# Patient Record
Sex: Female | Born: 1940
Health system: Southern US, Community
[De-identification: ages and names within clinical notes are randomized; demographics above are authoritative.]

## PROBLEM LIST (undated history)

## (undated) DIAGNOSIS — E785 Hyperlipidemia, unspecified: Secondary | ICD-10-CM

## (undated) DIAGNOSIS — M159 Polyosteoarthritis, unspecified: Secondary | ICD-10-CM

## (undated) DIAGNOSIS — I10 Essential (primary) hypertension: Secondary | ICD-10-CM

## (undated) HISTORY — DX: Essential (primary) hypertension: I10

## (undated) HISTORY — PX: APPENDECTOMY: SHX54

## (undated) HISTORY — PX: TONSILLECTOMY: SUR1361

## (undated) HISTORY — DX: Polyosteoarthritis, unspecified: M15.9

## (undated) HISTORY — DX: Hyperlipidemia, unspecified: E78.5

---

## 1998-01-06 LAB — HM DEXA SCAN: HM Dexa Scan: NORMAL

## 2005-12-22 ENCOUNTER — Emergency Department (HOSPITAL_COMMUNITY): Admission: EM | Admit: 2005-12-22 | Discharge: 2005-12-22 | Payer: Self-pay | Admitting: Emergency Medicine

## 2005-12-24 ENCOUNTER — Emergency Department (HOSPITAL_COMMUNITY): Admission: EM | Admit: 2005-12-24 | Discharge: 2005-12-24 | Payer: Self-pay | Admitting: Family Medicine

## 2007-02-19 ENCOUNTER — Emergency Department (HOSPITAL_COMMUNITY): Admission: EM | Admit: 2007-02-19 | Discharge: 2007-02-19 | Payer: Self-pay | Admitting: Emergency Medicine

## 2009-01-06 LAB — HM COLONOSCOPY

## 2009-01-29 IMAGING — CT CT CERVICAL SPINE W/O CM
4 of 5 series · 16 of 33 positions shown, 19 images · IV contrast (agent unspecified)
Comparison: None

CLINICAL DATA: Headache and neck pain with bilateral arm numbness status post fall.
 HEAD CT WITHOUT CONTRAST:
TECHNIQUE: Contiguous axial images were obtained from the base of the skull through the vertex according to standard protocol without contrast.
TECHNIQUE: Multidetector CT imaging of the cervical spine was performed.  Multiplanar CT  image reconstructions were also generated.

[Series 6: c_spine 2.0 b31s · axial · 0.25mm/px · z∈[-440,-356]mm · 3 of 85 slices shown]
[im 22/85  bone]
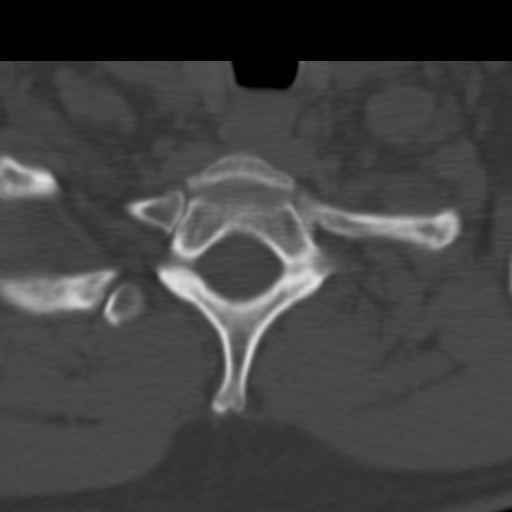
[im 43/85  bone]
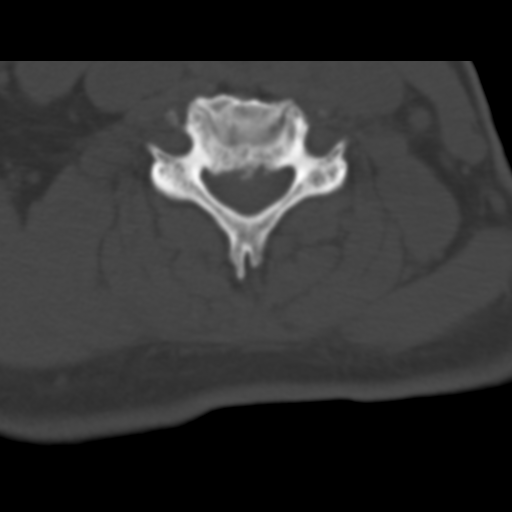
[im 64/85  bone]
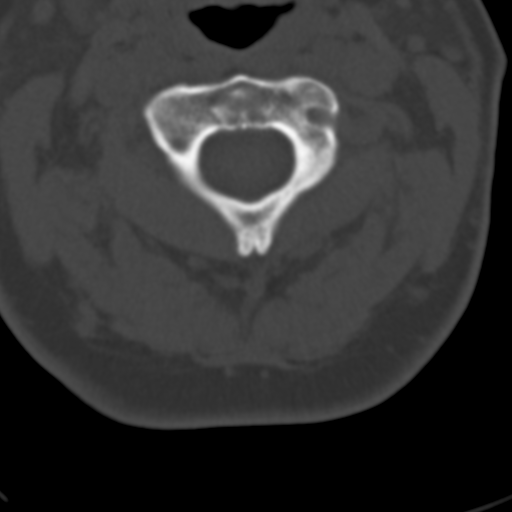

[Series 602: <mpr thick range> · coronal · 0.33mm/px · 3 of 74 slices shown]
[im 15/74  bone]
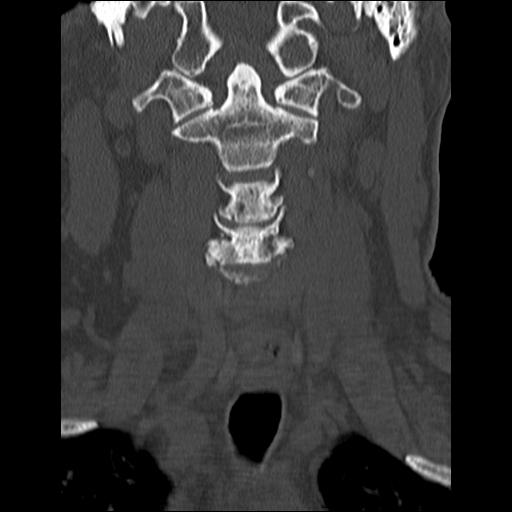
[im 30/74  bone]
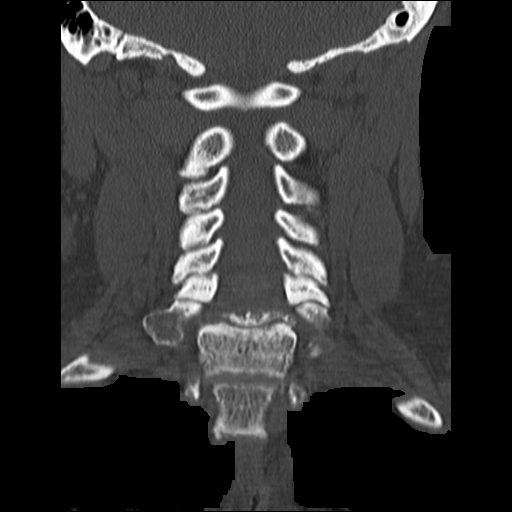
[im 44/74  bone]
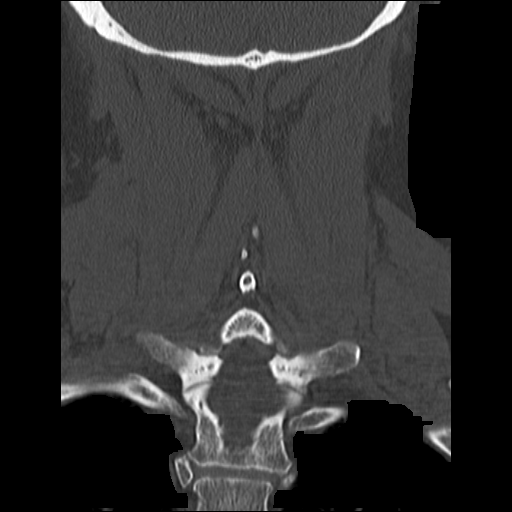

[Series 603: <mpr thick range(1)> · sagittal · 0.33mm/px · 5 of 61 slices shown, 6 images]
[im 21/61  bone]
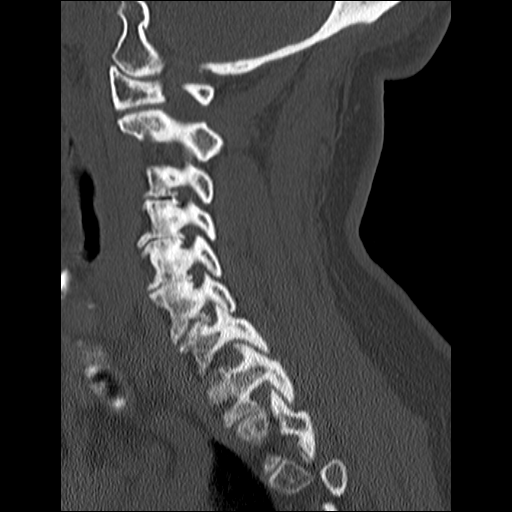
[im 26/61  bone]
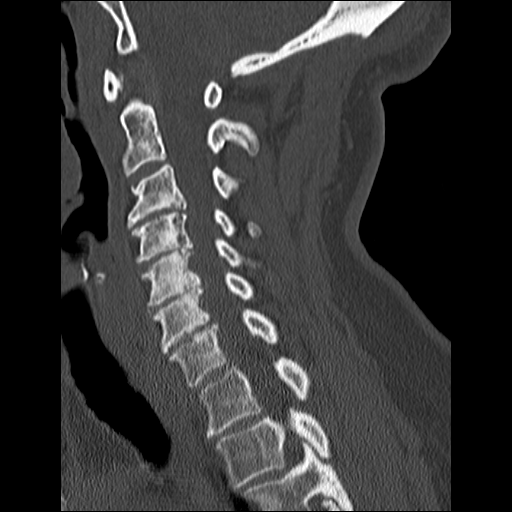
[im 31/61  soft-tissue]
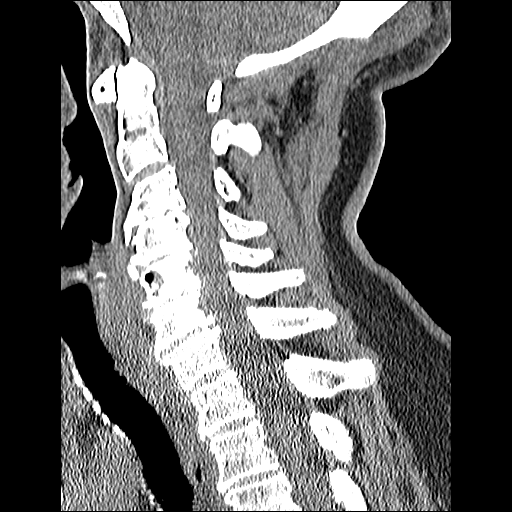
[im 31/61  bone]
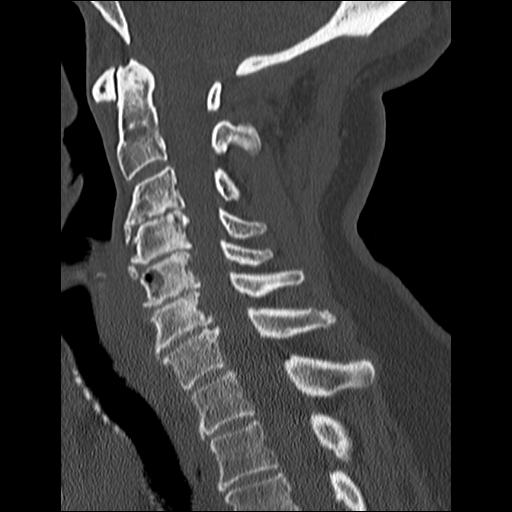
[im 36/61  bone]
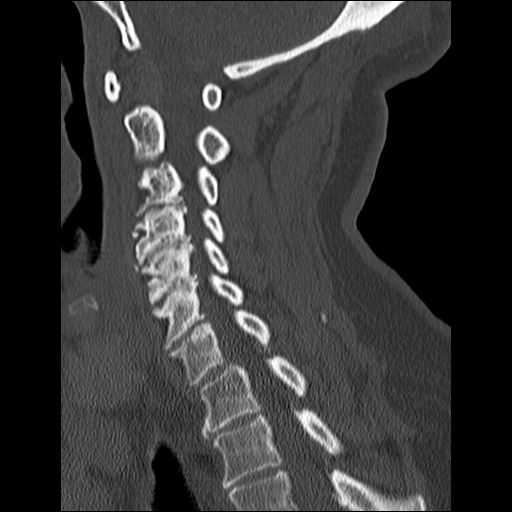
[im 41/61  bone]
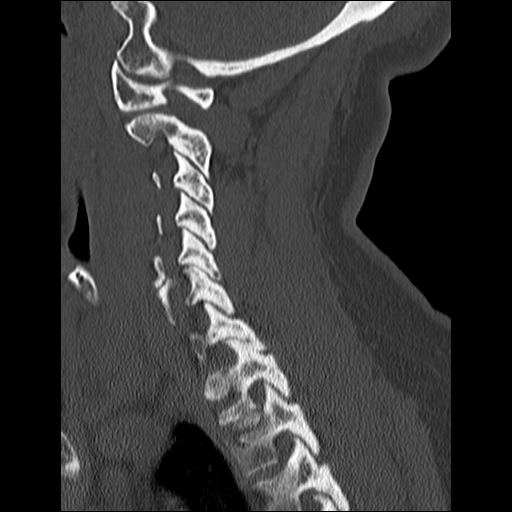

[Series 604: <mpr thick range(2)> · axial · 0.33mm/px · z∈[-484,-366]mm · 5 of 123 slices shown, 7 images]
[im 21/123  soft-tissue]
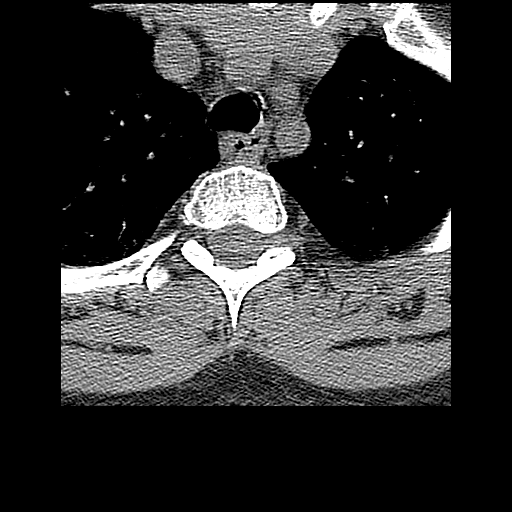
[im 21/123  bone]
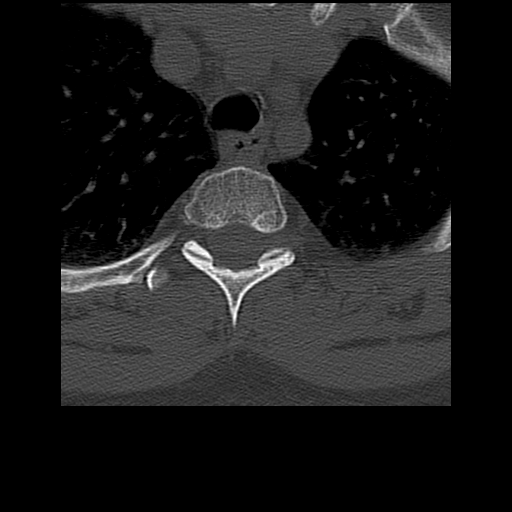
[im 41/123  bone]
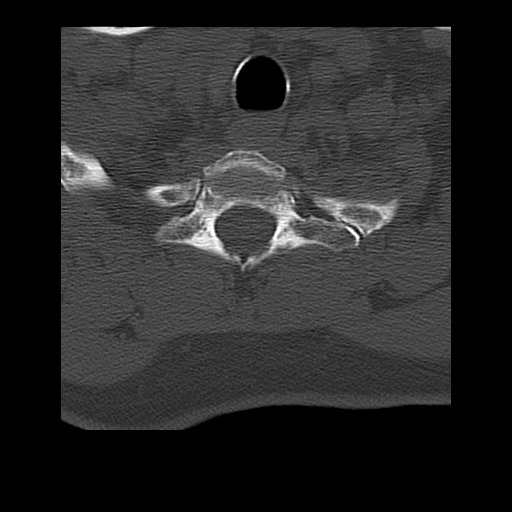
[im 62/123  bone]
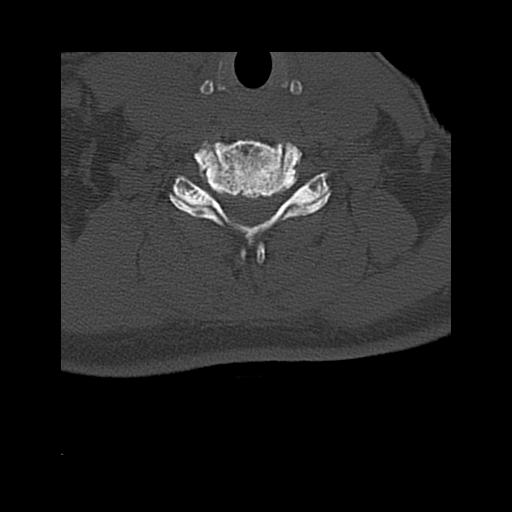
[im 82/123  bone]
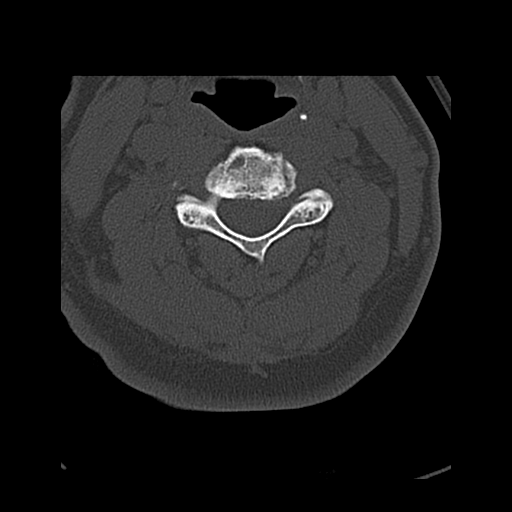
[im 102/123  soft-tissue]
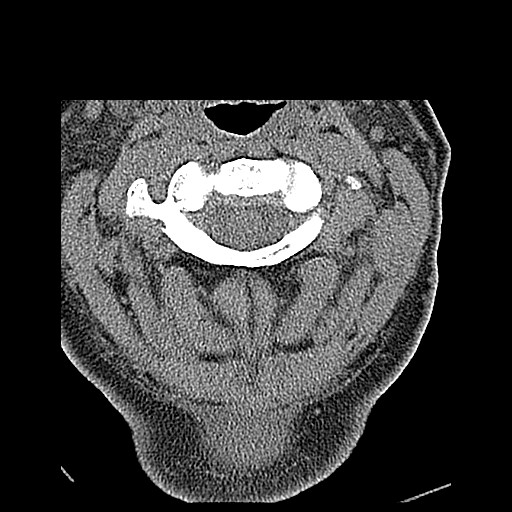
[im 102/123  bone]
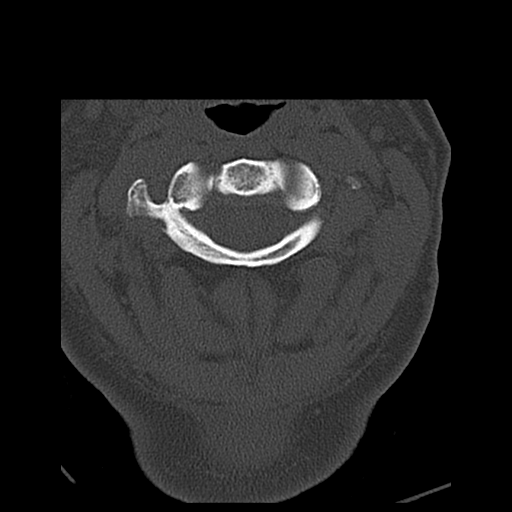

[16 of 33 positions shown; findings below may reference images not displayed]

FINDINGS: There is no evidence of acute intracranial hemorrhage, mass effect, brain edema, or extra-axial fluid collection.  Low density in the right lentiform nucleus may reflect a prominent perivascular space or remote lacunar infarct.  There is no evidence of cortical stroke.  The visualized paranasal sinuses are clear.  The calvarium is intact.
IMPRESSION: No acute intracranial or calvarial findings.  Old lacunar stroke in the right lentiform nucleus versus prominent perivascular space.
 CERVICAL SPINE CT WITHOUT CONTRAST:
FINDINGS: The alignment is normal.  There is no evidence of acute fracture or subluxation.  No acute soft tissue abnormalities are demonstrated.
 The patient has extensive cervical spondylosis with disk space loss, end plate cystic change and posterior osteophytes.  These contribute to mild spinal stenosis at several levels.  Notably the AP diameter of the canal is narrowed to approximately 9 mm at C4-5 and C5-6.  The foramina are narrowed at multiple levels as well.
IMPRESSION: Negative for acute cervical spine fracture, subluxation, or static signs of instability.  Diffuse cervical spondylosis as described.

## 2010-01-06 HISTORY — PX: CATARACT EXTRACTION W/ INTRAOCULAR LENS  IMPLANT, BILATERAL: SHX1307

## 2012-08-25 ENCOUNTER — Ambulatory Visit: Payer: Self-pay | Admitting: Internal Medicine

## 2012-11-09 ENCOUNTER — Encounter: Payer: Self-pay | Admitting: Internal Medicine

## 2012-11-09 ENCOUNTER — Ambulatory Visit (INDEPENDENT_AMBULATORY_CARE_PROVIDER_SITE_OTHER): Payer: Medicare Other | Admitting: Internal Medicine

## 2012-11-09 VITALS — BP 140/80 | HR 52 | Temp 98.6°F | Ht 65.0 in | Wt 208.0 lb

## 2012-11-09 DIAGNOSIS — E785 Hyperlipidemia, unspecified: Secondary | ICD-10-CM

## 2012-11-09 DIAGNOSIS — I10 Essential (primary) hypertension: Secondary | ICD-10-CM | POA: Insufficient documentation

## 2012-11-09 DIAGNOSIS — M159 Polyosteoarthritis, unspecified: Secondary | ICD-10-CM | POA: Insufficient documentation

## 2012-11-09 DIAGNOSIS — Z23 Encounter for immunization: Secondary | ICD-10-CM

## 2012-11-09 MED ORDER — METOPROLOL TARTRATE 100 MG PO TABS: 100.0000 mg | ORAL_TABLET | Freq: Every day | ORAL | Status: DC

## 2012-11-09 MED ORDER — SIMVASTATIN 20 MG PO TABS: 20.0000 mg | ORAL_TABLET | Freq: Every day | ORAL | Status: DC

## 2012-11-09 NOTE — Addendum Note (Signed)
Addended by: Sueanne Margarita on: 11/09/2012 12:48 PM   Modules accepted: Orders

## 2012-11-09 NOTE — Patient Instructions (Signed)
DASH Diet  The DASH diet stands for "Dietary Approaches to Stop Hypertension." It is a healthy eating plan that has been shown to reduce high blood pressure (hypertension) in as little as 14 days, while also possibly providing other significant health benefits. These other health benefits include reducing the risk of breast cancer after menopause and reducing the risk of type 2 diabetes, heart disease, colon cancer, and stroke. Health benefits also include weight loss and slowing kidney failure in patients with chronic kidney disease.   DIET GUIDELINES  · Limit salt (sodium). Your diet should contain less than 1500 mg of sodium daily.  · Limit refined or processed carbohydrates. Your diet should include mostly whole grains. Desserts and added sugars should be used sparingly.  · Include small amounts of heart-healthy fats. These types of fats include nuts, oils, and tub margarine. Limit saturated and trans fats. These fats have been shown to be harmful in the body.  CHOOSING FOODS   The following food groups are based on a 2000 calorie diet. See your Registered Dietitian for individual calorie needs.  Grains and Grain Products (6 to 8 servings daily)  · Eat More Often: Whole-wheat bread, brown rice, whole-grain or wheat pasta, quinoa, popcorn without added fat or salt (air popped).  · Eat Less Often: White bread, white pasta, white rice, cornbread.  Vegetables (4 to 5 servings daily)  · Eat More Often: Fresh, frozen, and canned vegetables. Vegetables may be raw, steamed, roasted, or grilled with a minimal amount of fat.  · Eat Less Often/Avoid: Creamed or fried vegetables. Vegetables in a cheese sauce.  Fruit (4 to 5 servings daily)  · Eat More Often: All fresh, canned (in natural juice), or frozen fruits. Dried fruits without added sugar. One hundred percent fruit juice (½ cup [237 mL] daily).  · Eat Less Often: Dried fruits with added sugar. Canned fruit in light or heavy syrup.  Lean Meats, Fish, and Poultry (2  servings or less daily. One serving is 3 to 4 oz [85-114 g]).  · Eat More Often: Ninety percent or leaner ground beef, tenderloin, sirloin. Round cuts of beef, chicken breast, turkey breast. All fish. Grill, bake, or broil your meat. Nothing should be fried.  · Eat Less Often/Avoid: Fatty cuts of meat, turkey, or chicken leg, thigh, or wing. Fried cuts of meat or fish.  Dairy (2 to 3 servings)  · Eat More Often: Low-fat or fat-free milk, low-fat plain or light yogurt, reduced-fat or part-skim cheese.  · Eat Less Often/Avoid: Milk (whole, 2%). Whole milk yogurt. Full-fat cheeses.  Nuts, Seeds, and Legumes (4 to 5 servings per week)  · Eat More Often: All without added salt.  · Eat Less Often/Avoid: Salted nuts and seeds, canned beans with added salt.  Fats and Sweets (limited)  · Eat More Often: Vegetable oils, tub margarines without trans fats, sugar-free gelatin. Mayonnaise and salad dressings.  · Eat Less Often/Avoid: Coconut oils, palm oils, butter, stick margarine, cream, half and half, cookies, candy, pie.  FOR MORE INFORMATION  The Dash Diet Eating Plan: www.dashdiet.org  Document Released: 12/12/2010 Document Revised: 03/17/2011 Document Reviewed: 12/12/2010  Exit[employer]® Patient Information ©2014 [employer].

## 2012-11-09 NOTE — Assessment & Plan Note (Signed)
Mostly knees and back Discussed acetaminophen Needs to pick up on exercise

## 2012-11-09 NOTE — Addendum Note (Signed)
Addended by: Sueanne Margarita on: 11/09/2012 02:08 PM   Modules accepted: Orders

## 2012-11-09 NOTE — Progress Notes (Signed)
Subjective:    Patient ID: Kathryn Lopez, female    DOB: 30-May-1940, 72 y.o.   MRN: [id number]  HPI Here to establish Needed to change due to insurance change  Reviewed records from [facility]  Has hypertension Started on Rx for about 3 years Had noticed dizziness then Has had good control on the current medication  On statin for cholesterol No myalgias No stomach aching or GI problems  No current outpatient prescriptions on file prior to visit.   No current facility-administered medications on file prior to visit.    No Known Allergies  Past Medical History  Diagnosis Date  . Hyperlipidemia   . Hypertension     Past Surgical History  Procedure Laterality Date  . Appendectomy    . Tonsillectomy      Family History  Problem Relation Age of Onset  . Stroke Father   . Hypertension Sister   . Heart disease Sister     heart valve replaced  . Diabetes Brother   . Hypertension Brother   . Cancer Maternal Grandmother     breast cancer  . Diabetes Brother   . Hypertension Brother   . Hypertension Sister   . Hypertension Sister   . Hypertension Sister     History   Social History  . Marital Status: Divorced    Spouse Name: N/A    Number of Children: 1  . 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     Social History Main Topics  . Smoking status: Never Smoker   . Smokeless tobacco: Never Used  . Alcohol Use: No  . Drug Use: No  . Sexual Activity: Not on file   Other Topics Concern  . Not on file   Social History Narrative   Widowed twice then divorced   One son      No living will   Would have son be health care POA if needed   Not sure about DNR-- very adamant about not being kept on life support   No feeding tube!!   Review of Systems  Constitutional: Positive for unexpected weight change. Negative for fatigue.       Weight slowly going up   HENT: Positive for rhinorrhea. Negative for  dental problem and hearing loss.        Full dentures  Eyes: Negative for visual disturbance.       Had cataract surgery  Respiratory: Negative for cough, chest tightness and shortness of breath.   Cardiovascular: Negative for chest pain, palpitations and leg swelling.  Gastrointestinal: Negative for nausea, vomiting, abdominal pain, constipation and blood in stool.       No heartburn  Endocrine: Negative for cold intolerance and heat intolerance.  Genitourinary: Negative for dysuria, hematuria and difficulty urinating.  Musculoskeletal: Positive for arthralgias and back pain. Negative for joint swelling.       Some knee and back pain--doesn't use meds  Skin: Negative for rash.       No suspicious lesions  Allergic/Immunologic: Positive for environmental allergies. Negative for immunocompromised state.       ?mild allergies  Neurological: Negative for dizziness, syncope, weakness and light-headedness.  Hematological: Negative for adenopathy. Does not bruise/bleed easily.  Psychiatric/Behavioral: Negative for sleep disturbance and dysphoric mood. The patient is not nervous/anxious.        Nocturia x 1       Objective:   Physical Exam  Constitutional:  She is oriented to person, place, and time. She appears well-developed and well-nourished. No distress.  HENT:  Mouth/Throat: Oropharynx is clear and moist. No oropharyngeal exudate.  Eyes: EOM are normal. Pupils are equal, round, and reactive to light.  Neck: Normal range of motion. Neck supple. No thyromegaly present.  Cardiovascular: Normal rate, regular rhythm, normal heart sounds and intact distal pulses.  Exam reveals no gallop.   No murmur heard. Pulmonary/Chest: Effort normal and breath sounds normal. No respiratory distress. She has no wheezes. She has no rales.  Abdominal: Soft. There is no tenderness.  Musculoskeletal: She exhibits no edema and no tenderness.  Early bunions bilaterally Thickening in knees  Lymphadenopathy:     She has no cervical adenopathy.  Neurological: She is alert and oriented to person, place, and time.  Skin: No rash noted. No erythema.  Mycotic toenails  Psychiatric: She has a normal mood and affect. Her behavior is normal.          Assessment & Plan:

## 2012-11-09 NOTE — Assessment & Plan Note (Signed)
BP Readings from Last 3 Encounters:  11/09/12 140/80   Good control Recent labs No changes needed

## 2012-11-09 NOTE — Assessment & Plan Note (Signed)
LDL 98 on rx No troubles with the med Will continue

## 2013-04-14 ENCOUNTER — Other Ambulatory Visit: Payer: Self-pay | Admitting: *Deleted

## 2013-04-14 MED ORDER — SIMVASTATIN 20 MG PO TABS
20.0000 mg | ORAL_TABLET | Freq: Every day | ORAL | Status: DC
Start: 2013-04-14 — End: 2013-12-05

## 2013-04-14 MED ORDER — METOPROLOL TARTRATE 100 MG PO TABS: 100.0000 mg | ORAL_TABLET | Freq: Every day | ORAL | Status: DC

## 2013-06-16 ENCOUNTER — Ambulatory Visit (INDEPENDENT_AMBULATORY_CARE_PROVIDER_SITE_OTHER): Payer: Medicare Other | Admitting: Family Medicine

## 2013-06-16 ENCOUNTER — Encounter (INDEPENDENT_AMBULATORY_CARE_PROVIDER_SITE_OTHER): Payer: Self-pay

## 2013-06-16 ENCOUNTER — Encounter: Payer: Self-pay | Admitting: Family Medicine

## 2013-06-16 VITALS — BP 136/78 | HR 60 | Temp 98.2°F | Wt 204.0 lb

## 2013-06-16 DIAGNOSIS — J209 Acute bronchitis, unspecified: Secondary | ICD-10-CM | POA: Insufficient documentation

## 2013-06-16 MED ORDER — GUAIFENESIN-CODEINE 100-10 MG/5ML PO SYRP: 5.0000 mL | ORAL_SOLUTION | Freq: Every evening | ORAL | Status: DC | PRN

## 2013-06-16 MED ORDER — AZITHROMYCIN 250 MG PO TABS
ORAL_TABLET | ORAL | Status: DC
Start: 2013-06-16 — End: 2013-12-05

## 2013-06-16 NOTE — Progress Notes (Signed)
Pre visit review using our clinic review tool, if applicable. No additional management support is needed unless otherwise documented below in the visit note. 

## 2013-06-16 NOTE — Patient Instructions (Signed)
I think you have viral bronchitis, but allergies contributing somewhat as well. Continue fexofenadine, start ibuprofen 400mg  twice daily with food for next few days. Push fluids and plenty of rest. May use codeine cough syrup to help with cough - but caution it may make you sleepy. If persistent or worsening productive cough, or any fever >101 or starting to feel worse instead of daily improvement, may fill zpack antibiotic (prescription provided today)

## 2013-06-16 NOTE — Assessment & Plan Note (Signed)
Anticipate viral bronchitis along with allergic rhinitis contribution. rec codeine cough syrup, ibuprofen 400mg  bid with food for next few days, continued allegra OTC. If worsening sxs, discussed filling zpack antibiotic course. Pt agrees with plan.

## 2013-06-16 NOTE — Progress Notes (Signed)
BP 136/78  Pulse 60  Temp(Src) 98.2 F (36.8 C) (Oral)  Wt 204 lb (92.534 kg)  SpO2 97%   CC: cough  Subjective:    Patient ID: Kathryn Lopez, female    DOB: 06-Dec-1940, 73 y.o.   MRN: [id number]  HPI: Kathryn Lopez is a 73 y.o. female presenting on 06/16/2013 for Cough   9d h/o cough. Coughing up mild clear phlegm. Hacking cough. Mild PNdrainage. Sore abd from coughing. Some dyspnea with cough. Did mow lawn prior to coughing starting.  No fevers/chills, nausea, ST, HA, ear or tooth pain. No rhinorrhea or congestion, sneezing. No wheezing or chest pain.  So far has tried nyquil cough syrup and fexofenadine OTC per pharm recommendation No sick contacts at home. No smokers at home. H/o h/o asthma.  Past Medical History  Diagnosis Date  . Hyperlipidemia   . Hypertension   . Osteoarthritis, multiple sites     Relevant past medical, surgical, family and social history reviewed and updated as indicated.  Allergies and medications reviewed and updated. Current Outpatient Prescriptions on File Prior to Visit  Medication Sig  . acetaminophen (TYLENOL) 325 MG tablet Take 650 mg by mouth 3 (three) times daily as needed for mild pain.  . metoprolol (LOPRESSOR) 100 MG tablet Take 1 tablet (100 mg total) by mouth daily.  . simvastatin (ZOCOR) 20 MG tablet Take 1 tablet (20 mg total) by mouth daily.   No current facility-administered medications on file prior to visit.    Review of Systems Per HPI unless specifically indicated above    Objective:    BP 136/78  Pulse 60  Temp(Src) 98.2 F (36.8 C) (Oral)  Wt 204 lb (92.534 kg)  SpO2 97%  Physical Exam  Nursing note and vitals reviewed. Constitutional: She appears well-developed and well-nourished. No distress.  HENT:  Head: Normocephalic and atraumatic.  Right Ear: Hearing, tympanic membrane, external ear and ear canal normal.  Left Ear: Hearing, tympanic membrane, external ear and ear canal normal.  Nose:  Mucosal edema present. No rhinorrhea. Right sinus exhibits no maxillary sinus tenderness and no frontal sinus tenderness. Left sinus exhibits no maxillary sinus tenderness and no frontal sinus tenderness.  Mouth/Throat: Uvula is midline, oropharynx is clear and moist and mucous membranes are normal. No oropharyngeal exudate, posterior oropharyngeal edema, posterior oropharyngeal erythema or tonsillar abscesses.  Pale boggy turbinates R>L despite pt's lack of nasal congestion sxs  Eyes: Conjunctivae and EOM are normal. Pupils are equal, round, and reactive to light. No scleral icterus.  Neck: Normal range of motion. Neck supple.  Cardiovascular: Normal rate, regular rhythm, normal heart sounds and intact distal pulses.   No murmur heard. Pulmonary/Chest: Effort normal and breath sounds normal. No respiratory distress. She has no wheezes. She has no rales.  Lymphadenopathy:    She has no cervical adenopathy.  Skin: Skin is warm and dry. No rash noted.   Results for orders placed in visit on 11/09/12  HM MAMMOGRAPHY      Result Value Ref Range   HM Mammogram done--okay    HM DEXA SCAN      Result Value Ref Range   HM Dexa Scan normal bone density    HM COLONOSCOPY      Result Value Ref Range   HM Colonoscopy benign--Dr Loreta Ave        Assessment & Plan:   Problem List Items Addressed This Visit   Acute bronchitis - Primary     Anticipate viral bronchitis along  with allergic rhinitis contribution. rec codeine cough syrup, ibuprofen 400mg  bid with food for next few days, continued allegra OTC. If worsening sxs, discussed filling zpack antibiotic course. Pt agrees with plan.        Follow up plan: Return if symptoms worsen or fail to improve.

## 2013-07-19 LAB — HM MAMMOGRAPHY

## 2013-07-25 ENCOUNTER — Encounter: Payer: Self-pay | Admitting: Internal Medicine

## 2013-07-25 ENCOUNTER — Encounter: Payer: Self-pay | Admitting: *Deleted

## 2013-11-11 ENCOUNTER — Encounter: Payer: Self-pay | Admitting: Internal Medicine

## 2013-11-16 ENCOUNTER — Encounter: Payer: Self-pay | Admitting: Internal Medicine

## 2013-12-05 ENCOUNTER — Ambulatory Visit (INDEPENDENT_AMBULATORY_CARE_PROVIDER_SITE_OTHER): Payer: Medicare Other | Admitting: Internal Medicine

## 2013-12-05 ENCOUNTER — Encounter: Payer: Self-pay | Admitting: Internal Medicine

## 2013-12-05 VITALS — BP 138/84 | HR 66 | Temp 97.9°F | Ht 63.5 in | Wt 195.2 lb

## 2013-12-05 DIAGNOSIS — I10 Essential (primary) hypertension: Secondary | ICD-10-CM

## 2013-12-05 DIAGNOSIS — E785 Hyperlipidemia, unspecified: Secondary | ICD-10-CM

## 2013-12-05 DIAGNOSIS — Z23 Encounter for immunization: Secondary | ICD-10-CM

## 2013-12-05 DIAGNOSIS — Z0000 Encounter for general adult medical examination without abnormal findings: Secondary | ICD-10-CM

## 2013-12-05 MED ORDER — SIMVASTATIN 20 MG PO TABS: 20.0000 mg | ORAL_TABLET | Freq: Every day | ORAL | Status: DC

## 2013-12-05 MED ORDER — METOPROLOL TARTRATE 100 MG PO TABS: 100.0000 mg | ORAL_TABLET | Freq: Every day | ORAL | Status: DC

## 2013-12-05 MED ORDER — ZOSTER VACCINE LIVE 19400 UNT/0.65ML ~~LOC~~ SOLR: 0.6500 mL | Freq: Once | SUBCUTANEOUS | Status: DC

## 2013-12-05 NOTE — Assessment & Plan Note (Signed)
BP Readings from Last 3 Encounters:  12/05/13 138/84  06/16/13 136/78  11/09/12 140/80   Good control No changes needed
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
## 2013-12-05 NOTE — Progress Notes (Signed)
Pre visit review using our clinic review tool, if applicable. No additional management support is needed unless otherwise documented below in the visit note. 

## 2013-12-05 NOTE — Assessment & Plan Note (Signed)
I have personally reviewed the Medicare Annual Wellness questionnaire and have noted 1. The patient's medical and social history 2. Their use of alcohol, tobacco or illicit drugs 3. Their current medications and supplements 4. The patient's functional ability including ADL's, fall risks, home safety risks and hearing or visual             impairment. 5. Diet and physical activities 6. Evidence for depression or mood disorders  The patients weight, height, BMI and visual acuity have been recorded in the chart I have made referrals, counseling and provided education to the patient based review of the above and I have provided the pt with a written personalized care plan for preventive services.  I have provided you with a copy of your personalized plan for preventive services. Please take the time to review along with your updated medication list.  Flu shot and prevnar today Rx for zostavax Will not need another screening colonoscopy due to age---and not sure about mammo (wouldn't be due till 2017 anyway) Discussed starting vitamin D Normal DEXA ~65---- no need for another

## 2013-12-05 NOTE — Patient Instructions (Addendum)
Please get your shingles vaccine at the pharmacy. Please start vitamin D daily (at least 800 international units)

## 2013-12-05 NOTE — Assessment & Plan Note (Signed)
Satisfied with primary prevention with statin Will continue

## 2013-12-06 ENCOUNTER — Encounter: Payer: Self-pay | Admitting: *Deleted

## 2013-12-06 LAB — COMPREHENSIVE METABOLIC PANEL
ALT: 21 U/L (ref 0–35)
AST: 27 U/L (ref 0–37)
Albumin: 4.4 g/dL (ref 3.5–5.2)
Alkaline Phosphatase: 76 U/L (ref 39–117)
BILIRUBIN TOTAL: 0.9 mg/dL (ref 0.2–1.2)
BUN: 18 mg/dL (ref 6–23)
CO2: 26 meq/L (ref 19–32)
Calcium: 9.2 mg/dL (ref 8.4–10.5)
Chloride: 104 mEq/L (ref 96–112)
Creatinine, Ser: 1.1 mg/dL (ref 0.4–1.2)
GFR: 50.39 mL/min — AB (ref >60.00)
GLUCOSE: 87 mg/dL (ref 70–99)
Potassium: 4.5 mEq/L (ref 3.5–5.1)
SODIUM: 139 meq/L (ref 135–145)
TOTAL PROTEIN: 7.5 g/dL (ref 6.0–8.3)

## 2013-12-06 LAB — LIPID PANEL
CHOLESTEROL: 161 mg/dL (ref 0–200)
HDL: 40.1 mg/dL (ref >39.00)
LDL Cholesterol: 98 mg/dL (ref 0–99)
NonHDL: 120.9
TRIGLYCERIDES: 113 mg/dL (ref 0.0–149.0)
Total CHOL/HDL Ratio: 4
VLDL: 22.6 mg/dL (ref 0.0–40.0)

## 2013-12-06 LAB — CBC WITH DIFFERENTIAL/PLATELET
Basophils Absolute: 0 10*3/uL (ref 0.0–0.1)
Basophils Relative: 0.7 % (ref 0.0–3.0)
EOS PCT: 2.5 % (ref 0.0–5.0)
Eosinophils Absolute: 0.1 10*3/uL (ref 0.0–0.7)
HEMATOCRIT: 43.3 % (ref 36.0–46.0)
Hemoglobin: 14.3 g/dL (ref 12.0–15.0)
LYMPHS ABS: 1.9 10*3/uL (ref 0.7–4.0)
Lymphocytes Relative: 35.4 % (ref 12.0–46.0)
MCHC: 32.9 g/dL (ref 30.0–36.0)
MCV: 90.4 fl (ref 78.0–100.0)
MONOS PCT: 8 % (ref 3.0–12.0)
Monocytes Absolute: 0.4 10*3/uL (ref 0.1–1.0)
NEUTROS PCT: 53.4 % (ref 43.0–77.0)
Neutro Abs: 2.8 10*3/uL (ref 1.4–7.7)
PLATELETS: 213 10*3/uL (ref 150.0–400.0)
RBC: 4.79 Mil/uL (ref 3.87–5.11)
RDW: 12.9 % (ref 11.5–15.5)
WBC: 5.3 10*3/uL (ref 4.0–10.5)

## 2013-12-06 LAB — T4, FREE: FREE T4: 0.73 ng/dL (ref 0.60–1.60)

## 2014-04-13 ENCOUNTER — Telehealth: Payer: Self-pay | Admitting: Internal Medicine

## 2014-04-13 NOTE — Telephone Encounter (Signed)
[employer] nurse did an at home visit with pt.  She stated that pt's bloood pressure was high, it was 180/90. Pt stated to the nurse that she is under some stress.  She is only taking Metoprolol for bp.  Nurse also stated that pt's HR is low, it was 52.  Nurse's number is [phone] if you have any questions or call the pt if you want to make any changes, as the nurse only does a once a year visit.  Thanks.

## 2014-04-13 NOTE — Telephone Encounter (Signed)
It sounds like we should have her come in for a visit to see what is going on. Those numbers don't sound like what we got here so something must have changed Please have her set up appt

## 2014-04-14 NOTE — Telephone Encounter (Signed)
Spoke with patient and she feels fine, she's under a lot of stress with her younger brother. She's trying to take care of him and that's why she thinks her BP is high.

## 2014-04-14 NOTE — Telephone Encounter (Signed)
Pt advised, and she verbalized understanding.

## 2014-04-14 NOTE — Telephone Encounter (Signed)
Please ask her to check her BP sometime when she is calm (like at a pharmacy). If it is still over 150/90, she should set up an appt

## 2014-04-14 NOTE — Telephone Encounter (Signed)
Pt called back with a BP reading. It was 150/84, and she says she feels fine.

## 2014-06-10 ENCOUNTER — Encounter (HOSPITAL_COMMUNITY): Payer: Self-pay

## 2014-06-10 DIAGNOSIS — I10 Essential (primary) hypertension: Secondary | ICD-10-CM | POA: Insufficient documentation

## 2014-06-10 DIAGNOSIS — E785 Hyperlipidemia, unspecified: Secondary | ICD-10-CM | POA: Insufficient documentation

## 2014-06-10 DIAGNOSIS — Z79899 Other long term (current) drug therapy: Secondary | ICD-10-CM | POA: Diagnosis not present

## 2014-06-10 DIAGNOSIS — M1990 Unspecified osteoarthritis, unspecified site: Secondary | ICD-10-CM | POA: Insufficient documentation

## 2014-06-10 NOTE — ED Notes (Signed)
Pt took BP at home today x 3 and pt thought that was high.  Pt took BP med today.  Pt has had mild headache today but otherwise no other s/s noted.  Pt appears little anxious because she reports her brother died and it was thought to have been d/t BP issues.

## 2014-06-11 ENCOUNTER — Emergency Department (HOSPITAL_COMMUNITY)
Admission: EM | Admit: 2014-06-11 | Discharge: 2014-06-11 | Disposition: A | Discharge status: Home / Self Care | Payer: Medicare Other | Attending: Emergency Medicine | Admitting: Emergency Medicine

## 2014-06-11 DIAGNOSIS — I10 Essential (primary) hypertension: Secondary | ICD-10-CM

## 2014-06-11 LAB — CBC WITH DIFFERENTIAL/PLATELET
BASOS ABS: 0 10*3/uL (ref 0.0–0.1)
Basophils Relative: 1 % (ref 0–1)
Eosinophils Absolute: 0.2 10*3/uL (ref 0.0–0.7)
Eosinophils Relative: 3 % (ref 0–5)
HCT: 44 % (ref 36.0–46.0)
Hemoglobin: 14.7 g/dL (ref 12.0–15.0)
LYMPHS ABS: 2.2 10*3/uL (ref 0.7–4.0)
LYMPHS PCT: 38 % (ref 12–46)
MCH: 30.4 pg (ref 26.0–34.0)
MCHC: 33.4 g/dL (ref 30.0–36.0)
MCV: 90.9 fL (ref 78.0–100.0)
MONO ABS: 0.6 10*3/uL (ref 0.1–1.0)
Monocytes Relative: 10 % (ref 3–12)
Neutro Abs: 2.7 10*3/uL (ref 1.7–7.7)
Neutrophils Relative %: 48 % (ref 43–77)
Platelets: 168 10*3/uL (ref 150–400)
RBC: 4.84 MIL/uL (ref 3.87–5.11)
RDW: 12.9 % (ref 11.5–15.5)
WBC: 5.7 10*3/uL (ref 4.0–10.5)

## 2014-06-11 LAB — I-STAT CHEM 8, ED
BUN: 13 mg/dL (ref 6–20)
CALCIUM ION: 1.15 mmol/L (ref 1.13–1.30)
CHLORIDE: 102 mmol/L (ref 101–111)
Creatinine, Ser: 1.2 mg/dL — ABNORMAL HIGH (ref 0.44–1.00)
Glucose, Bld: 114 mg/dL — ABNORMAL HIGH (ref 65–99)
HEMATOCRIT: 47 % — AB (ref 36.0–46.0)
HEMOGLOBIN: 16 g/dL — AB (ref 12.0–15.0)
POTASSIUM: 4.5 mmol/L (ref 3.5–5.1)
Sodium: 140 mmol/L (ref 135–145)
TCO2: 25 mmol/L (ref 0–100)

## 2014-06-11 LAB — URINE MICROSCOPIC-ADD ON

## 2014-06-11 LAB — URINALYSIS, ROUTINE W REFLEX MICROSCOPIC
Bilirubin Urine: NEGATIVE
GLUCOSE, UA: NEGATIVE mg/dL
KETONES UR: NEGATIVE mg/dL
NITRITE: NEGATIVE
PROTEIN: NEGATIVE mg/dL
Specific Gravity, Urine: 1.011 (ref 1.005–1.030)
Urobilinogen, UA: 0.2 mg/dL (ref 0.0–1.0)
pH: 7 (ref 5.0–8.0)

## 2014-06-11 MED ORDER — CLONIDINE HCL 0.1 MG PO TABS
0.1000 mg | ORAL_TABLET | Freq: Once | ORAL | Status: AC
  Administered 2014-06-11: 0.1 mg via ORAL
  Filled 2014-06-11: qty 1

## 2014-06-11 MED ORDER — LORAZEPAM 1 MG PO TABS
0.5000 mg | ORAL_TABLET | Freq: Once | ORAL | Status: AC
  Administered 2014-06-11: 0.5 mg via ORAL
  Filled 2014-06-11: qty 1

## 2014-06-11 NOTE — ED Notes (Signed)
Attempted IV x2, unsuccessful.  

## 2014-06-11 NOTE — ED Provider Notes (Signed)
CSN: [id number]     Arrival date & time 06/10/14  2058 History  This chart was scribed for Marisa Severin, MD by Evon Slack, ED Scribe. This patient was seen in room A03C/A03C and the patient's care was started at 2:06 AM.      Chief Complaint  Patient presents with  . Hypertension    The history is provided by the patient. No language interpreter was used.   HPI Comments: Kathryn Lopez is a 74 y.o. female with PMHX of HTN and hyperlipidemia who presents to the Emergency Department complaining of hypertension onset 1 day prior at 7 PM. Pt states she was checking her BP today and her systolic pressure was 146 and continued to rise.  Patient checked her blood pressure multiple times, and each time the systolic number continued to go up.  Pt states she has been compliant with taking her medications. PT denies any changes in her medications. Pt denies any recent diet changes. Pt denies HA, CP, SOB, weakness or numbness.   Past Medical History  Diagnosis Date  . Hyperlipidemia   . Hypertension   . Osteoarthritis, multiple sites    Past Surgical History  Procedure Laterality Date  . Appendectomy    . Tonsillectomy    . Cataract extraction w/ intraocular lens  implant, bilateral Bilateral 2012   Family History  Problem Relation Age of Onset  . Stroke Father   . Hypertension Sister   . Heart disease Sister     heart valve replaced  . Diabetes Brother   . Hypertension Brother   . Cancer Maternal Grandmother     breast cancer  . Diabetes Brother   . Hypertension Brother   . Hypertension Sister   . Hypertension Sister   . Hypertension Sister    History  Substance Use Topics  . Smoking status: Never Smoker   . Smokeless tobacco: Never Used  . Alcohol Use: No   OB History    No data available     Review of Systems  Respiratory: Negative for shortness of breath.   Cardiovascular: Negative for chest pain.  Neurological: Negative for weakness, numbness and headaches.   A  complete 10 system review of systems was obtained and all systems are negative except as noted in the HPI and PMH.     Allergies  Review of patient's allergies indicates no known allergies.  Home Medications   Prior to Admission medications   Medication Sig Start Date End Date Taking? Authorizing Provider  acetaminophen (TYLENOL) 325 MG tablet Take 650 mg by mouth 3 (three) times daily as needed for mild pain.    Historical Provider, MD  cholecalciferol (VITAMIN D) 1000 UNITS tablet Take 1,000 Units by mouth daily.    Historical Provider, MD  guaiFENesin-codeine (ROBITUSSIN AC) 100-10 MG/5ML syrup Take 5 mLs by mouth at bedtime as needed for cough (sedation precautions). 06/16/13   Eustaquio Boyden, MD  metoprolol (LOPRESSOR) 100 MG tablet Take 1 tablet (100 mg total) by mouth daily. 12/05/13   Karie Schwalbe, MD  simvastatin (ZOCOR) 20 MG tablet Take 1 tablet (20 mg total) by mouth daily. 12/05/13   Karie Schwalbe, MD  zoster vaccine live, PF, (ZOSTAVAX) 40981 UNT/0.65ML injection Inject 19,400 Units into the skin once. 12/05/13   Karie Schwalbe, MD   BP 197/69 mmHg  Pulse 54  Temp(Src) 98.2 F (36.8 C) (Oral)  Resp 16  SpO2 100%   Physical Exam  Constitutional: She is oriented to person, place,  and time. She appears well-developed and well-nourished.  HENT:  Head: Normocephalic and atraumatic.  Right Ear: External ear normal.  Left Ear: External ear normal.  Nose: Nose normal.  Mouth/Throat: Oropharynx is clear and moist.  Eyes: Conjunctivae and EOM are normal. Pupils are equal, round, and reactive to light.  Neck: Normal range of motion. Neck supple. No JVD present. No tracheal deviation present. No thyromegaly present.  Cardiovascular: Normal rate, regular rhythm, normal heart sounds and intact distal pulses.  Exam reveals no gallop and no friction rub.   No murmur heard. Pulmonary/Chest: Effort normal and breath sounds normal. No stridor. No respiratory distress. She  has no wheezes. She has no rales. She exhibits no tenderness.  Abdominal: Soft. Bowel sounds are normal. She exhibits no distension and no mass. There is no tenderness. There is no rebound and no guarding.  Musculoskeletal: Normal range of motion. She exhibits no edema or tenderness.  Lymphadenopathy:    She has no cervical adenopathy.  Neurological: She is alert and oriented to person, place, and time. She displays normal reflexes. No cranial nerve deficit. She exhibits normal muscle tone. Coordination normal.  Skin: Skin is warm and dry. No rash noted. No erythema. No pallor.  Psychiatric: She has a normal mood and affect. Her behavior is normal. Judgment and thought content normal.  Nursing note and vitals reviewed.   ED Course  Procedures (including critical care time) DIAGNOSTIC STUDIES: Oxygen Saturation is 97% on RA, normal by my interpretation.    COORDINATION OF CARE: 2:35 AM-Discussed treatment plan with pt at bedside and pt agreed to plan.     Labs Review Labs Reviewed  URINALYSIS, ROUTINE W REFLEX MICROSCOPIC (NOT [facility]) - Abnormal; Notable for the following:    APPearance CLOUDY (*)    Hgb urine dipstick SMALL (*)    Leukocytes, UA LARGE (*)    All other components within normal limits  URINE MICROSCOPIC-ADD ON - Abnormal; Notable for the following:    Squamous Epithelial / LPF MANY (*)    Bacteria, UA MANY (*)    All other components within normal limits  I-STAT CHEM 8, ED - Abnormal; Notable for the following:    Creatinine, Ser 1.20 (*)    Glucose, Bld 114 (*)    Hemoglobin 16.0 (*)    HCT 47.0 (*)    All other components within normal limits  CBC WITH DIFFERENTIAL/PLATELET    Imaging Review No results found.   EKG Interpretation None      MDM   Final diagnoses:  None      I personally performed the services described in this documentation, which was scribed in my presence. The recorded information has been reviewed and is  accurate.  74 year old female with hypertension.  She has no complaints other than high blood pressure.  She has been under stress recently.  She is concerned that she is going to have a brain bleed like her brother did.  Patient's workup here has shown no signs of end organ damage.  She has a primary care doctor.  She can follow-up with.  She has been instructed to take her blood pressure twice a day, write down, and only return to the emergency department if she is having any chest pain, shortness of breath, headache, or other concerns.  Patient agrees with plan.     Marisa Severinlga Darold Miley, MD 06/11/14 [phone]

## 2014-06-11 NOTE — Discharge Instructions (Signed)
Check your blood pressure twice a day and write it down.  Do not worry about the numbers unless she were having symptoms such as headache, shortness of breath, chest pain, weakness, dizziness or numbness.  If you have any symptoms, please return to the emergency department.  Call your doctor's office on Monday to schedule a visit.   DASH Eating Plan DASH stands for "Dietary Approaches to Stop Hypertension." The DASH eating plan is a healthy eating plan that has been shown to reduce high blood pressure (hypertension). Additional health benefits may include reducing the risk of type 2 diabetes mellitus, heart disease, and stroke. The DASH eating plan may also help with weight loss. WHAT DO I NEED TO KNOW ABOUT THE DASH EATING PLAN? For the DASH eating plan, you will follow these general guidelines:  Choose foods with a percent daily value for sodium of less than 5% (as listed on the food label).  Use salt-free seasonings or herbs instead of table salt or sea salt.  Check with your health care provider or pharmacist before using salt substitutes.  Eat lower-sodium products, often labeled as "lower sodium" or "no salt added."  Eat fresh foods.  Eat more vegetables, fruits, and low-fat dairy products.  Choose whole grains. Look for the word "whole" as the first word in the ingredient list.  Choose fish and skinless chicken or [place] more often than red meat. Limit fish, poultry, and meat to 6 oz (170 g) each day.  Limit sweets, desserts, sugars, and sugary drinks.  Choose heart-healthy fats.  Limit cheese to 1 oz (28 g) per day.  Eat more home-cooked food and less restaurant, buffet, and fast food.  Limit fried foods.  Cook foods using methods other than frying.  Limit canned vegetables. If you do use them, rinse them well to decrease the sodium.  When eating at a restaurant, ask that your food be prepared with less salt, or no salt if possible. WHAT FOODS CAN I EAT? Seek help from  a dietitian for individual calorie needs. Grains Whole grain or whole wheat bread. Brown rice. Whole grain or whole wheat pasta. Quinoa, bulgur, and whole grain cereals. Low-sodium cereals. Corn or whole wheat flour tortillas. Whole grain cornbread. Whole grain crackers. Low-sodium crackers. Vegetables Fresh or frozen vegetables (raw, steamed, roasted, or grilled). Low-sodium or reduced-sodium tomato and vegetable juices. Low-sodium or reduced-sodium tomato sauce and paste. Low-sodium or reduced-sodium canned vegetables.  Fruits All fresh, canned (in natural juice), or frozen fruits. Meat and Other Protein Products Ground beef (85% or leaner), grass-fed beef, or beef trimmed of fat. Skinless chicken or [place]. Ground chicken or [place]. Pork trimmed of fat. All fish and seafood. Eggs. Dried beans, peas, or lentils. Unsalted nuts and seeds. Unsalted canned beans. Dairy Low-fat dairy products, such as skim or 1% milk, 2% or reduced-fat cheeses, low-fat ricotta or cottage cheese, or plain low-fat yogurt. Low-sodium or reduced-sodium cheeses. Fats and Oils Tub margarines without trans fats. Light or reduced-fat mayonnaise and salad dressings (reduced sodium). Avocado. Safflower, olive, or canola oils. Natural peanut or almond butter. Other Unsalted popcorn and pretzels. The items listed above may not be a complete list of recommended foods or beverages. Contact your dietitian for more options. WHAT FOODS ARE NOT RECOMMENDED? Grains White bread. White pasta. White rice. Refined cornbread. Bagels and croissants. Crackers that contain trans fat. Vegetables Creamed or fried vegetables. Vegetables in a cheese sauce. Regular canned vegetables. Regular canned tomato sauce and paste. Regular tomato and vegetable juices.  Fruits Dried fruits. Canned fruit in light or heavy syrup. Fruit juice. Meat and Other Protein Products Fatty cuts of meat. Ribs, chicken wings, bacon, sausage, bologna, salami,  chitterlings, fatback, hot dogs, bratwurst, and packaged luncheon meats. Salted nuts and seeds. Canned beans with salt. Dairy Whole or 2% milk, cream, half-and-half, and cream cheese. Whole-fat or sweetened yogurt. Full-fat cheeses or blue cheese. Nondairy creamers and whipped toppings. Processed cheese, cheese spreads, or cheese curds. Condiments Onion and garlic salt, seasoned salt, table salt, and sea salt. Canned and packaged gravies. Worcestershire sauce. Tartar sauce. Barbecue sauce. Teriyaki sauce. Soy sauce, including reduced sodium. Steak sauce. Fish sauce. Oyster sauce. Cocktail sauce. Horseradish. Ketchup and mustard. Meat flavorings and tenderizers. Bouillon cubes. Hot sauce. Tabasco sauce. Marinades. Taco seasonings. Relishes. Fats and Oils Butter, stick margarine, lard, shortening, ghee, and bacon fat. Coconut, palm kernel, or palm oils. Regular salad dressings. Other Pickles and olives. Salted popcorn and pretzels. The items listed above may not be a complete list of foods and beverages to avoid. Contact your dietitian for more information. WHERE CAN I FIND MORE INFORMATION? National Heart, Lung, and Blood Institute: [employer] Document Released: 12/12/2010 Document Revised: 05/09/2013 Document Reviewed: 10/27/2012 [facility] Patient Information 2015 [place], [place]. This information is not intended to replace advice given to you by your health care provider. Make sure you discuss any questions you have with your health care provider.  Managing Your High Blood Pressure Blood pressure is a measurement of how forceful your blood is pressing against the walls of the arteries. Arteries are muscular tubes within the circulatory system. Blood pressure does not stay the same. Blood pressure rises when you are active, excited, or nervous; and it lowers during sleep and relaxation. If the numbers measuring your blood pressure stay above normal most of the  time, you are at risk for health problems. High blood pressure (hypertension) is a long-term (chronic) condition in which blood pressure is elevated. A blood pressure reading is recorded as two numbers, such as 120 over 80 (or 120/80). The first, higher number is called the systolic pressure. It is a measure of the pressure in your arteries as the heart beats. The second, lower number is called the diastolic pressure. It is a measure of the pressure in your arteries as the heart relaxes between beats.  Keeping your blood pressure in a normal range is important to your overall health and prevention of health problems, such as heart disease and stroke. When your blood pressure is uncontrolled, your heart has to work harder than normal. High blood pressure is a very common condition in adults because blood pressure tends to rise with age. Men and women are equally likely to have hypertension but at different times in life. Before age 69, men are more likely to have hypertension. After 76 years of age, women are more likely to have it. Hypertension is especially common in African Americans. This condition often has no signs or symptoms. The cause of the condition is usually not known. Your caregiver can help you come up with a plan to keep your blood pressure in a normal, healthy range. BLOOD PRESSURE STAGES Blood pressure is classified into four stages: normal, prehypertension, stage 1, and stage 2. Your blood pressure reading will be used to determine what type of treatment, if any, is necessary. Appropriate treatment options are tied to these four stages:  Normal  Systolic pressure (mm Hg): below 120.  Diastolic pressure (mm Hg): below 80. Prehypertension  Systolic pressure (mm  Hg): 120 to 139.  Diastolic pressure (mm Hg): 80 to 89. Stage1  Systolic pressure (mm Hg): 140 to 159.  Diastolic pressure (mm Hg): 90 to 99. Stage2  Systolic pressure (mm Hg): 160 or above.  Diastolic pressure (mm  Hg): 100 or above. RISKS RELATED TO HIGH BLOOD PRESSURE Managing your blood pressure is an important responsibility. Uncontrolled high blood pressure can lead to:  A heart attack.  A stroke.  A weakened blood vessel (aneurysm).  Heart failure.  Kidney damage.  Eye damage.  Metabolic syndrome.  Memory and concentration problems. HOW TO MANAGE YOUR BLOOD PRESSURE Blood pressure can be managed effectively with lifestyle changes and medicines (if needed). Your caregiver will help you come up with a plan to bring your blood pressure within a normal range. Your plan should include the following: Education  Read all information provided by your caregivers about how to control blood pressure.  Educate yourself on the latest guidelines and treatment recommendations. New research is always being done to further define the risks and treatments for high blood pressure. Lifestylechanges  Control your weight.  Avoid smoking.  Stay physically active.  Reduce the amount of salt in your diet.  Reduce stress.  Control any chronic conditions, such as high cholesterol or diabetes.  Reduce your alcohol intake. Medicines  Several medicines (antihypertensive medicines) are available, if needed, to bring blood pressure within a normal range. Communication  Review all the medicines you take with your caregiver because there may be side effects or interactions.  Talk with your caregiver about your diet, exercise habits, and other lifestyle factors that may be contributing to high blood pressure.  See your caregiver regularly. Your caregiver can help you create and adjust your plan for managing high blood pressure. RECOMMENDATIONS FOR TREATMENT AND FOLLOW-UP  The following recommendations are based on current guidelines for managing high blood pressure in nonpregnant adults. Use these recommendations to identify the proper follow-up period or treatment option based on your blood pressure  reading. You can discuss these options with your caregiver.  Systolic pressure of 120 to 139 or diastolic pressure of 80 to 89: Follow up with your caregiver as directed.  Systolic pressure of 140 to 160 or diastolic pressure of 90 to 100: Follow up with your caregiver within 2 months.  Systolic pressure above 160 or diastolic pressure above 100: Follow up with your caregiver within 1 month.  Systolic pressure above 180 or diastolic pressure above 110: Consider antihypertensive therapy; follow up with your caregiver within 1 week.  Systolic pressure above 200 or diastolic pressure above 120: Begin antihypertensive therapy; follow up with your caregiver within 1 week. Document Released: 09/17/2011 Document Reviewed: 09/17/2011 [facility] Patient Information 2015 [place], [place]. This information is not intended to replace advice given to you by your health care provider. Make sure you discuss any questions you have with your health care provider.

## 2014-06-13 ENCOUNTER — Encounter: Payer: Self-pay | Admitting: Internal Medicine

## 2014-06-13 ENCOUNTER — Ambulatory Visit (INDEPENDENT_AMBULATORY_CARE_PROVIDER_SITE_OTHER): Payer: Medicare Other | Admitting: Internal Medicine

## 2014-06-13 VITALS — BP 128/80 | HR 51 | Temp 97.9°F | Wt 193.0 lb

## 2014-06-13 DIAGNOSIS — I10 Essential (primary) hypertension: Secondary | ICD-10-CM

## 2014-06-13 MED ORDER — LOSARTAN POTASSIUM 50 MG PO TABS: 50.0000 mg | ORAL_TABLET | Freq: Every day | ORAL | Status: DC

## 2014-06-13 MED ORDER — METOPROLOL TARTRATE 50 MG PO TABS: 50.0000 mg | ORAL_TABLET | Freq: Two times a day (BID) | ORAL | Status: DC

## 2014-06-13 NOTE — Progress Notes (Signed)
   Subjective:    Patient ID: Kathryn Lopez, female    Kathryn Lopez: 1940-06-15, 74 y.o.   MRN: [id number]  HPI Here for ER follow up  She had checked her BP  Was as high as 175 systolic with her own cuff Up to 190 in ER and then higher--but I don't see that in the records  No headache No chest pain No SOB No dizziness or syncope BP was 180/90 with RN home visit from insurance (but had lots of stress then)  Current Outpatient Prescriptions on File Prior to Visit  Medication Sig Dispense Refill  . acetaminophen (TYLENOL) 325 MG tablet Take 650 mg by mouth 3 (three) times daily as needed for mild pain.    . cholecalciferol (VITAMIN D) 1000 UNITS tablet Take 1,000 Units by mouth daily.    . metoprolol (LOPRESSOR) 100 MG tablet Take 1 tablet (100 mg total) by mouth daily. 90 tablet 3  . simvastatin (ZOCOR) 20 MG tablet Take 1 tablet (20 mg total) by mouth daily. 90 tablet 3   No current facility-administered medications on file prior to visit.    No Known Allergies  Past Medical History  Diagnosis Date  . Hyperlipidemia   . Hypertension   . Osteoarthritis, multiple sites     Past Surgical History  Procedure Laterality Date  . Appendectomy    . Tonsillectomy    . Cataract extraction w/ intraocular lens  implant, bilateral Bilateral 2012    Family History  Problem Relation Age of Onset  . Stroke Father   . Hypertension Sister   . Heart disease Sister     heart valve replaced  . Diabetes Brother   . Hypertension Brother   . Cancer Maternal Grandmother     breast cancer  . Diabetes Brother   . Hypertension Brother   . Hypertension Sister   . Hypertension Sister   . Hypertension Sister     History   Social History  . Marital Status: Divorced    Spouse Name: N/A  . Number of Children: 1  . 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     Social History Main Topics  . Smoking status: Never Smoker   . Smokeless tobacco:  Never Used  . Alcohol Use: No  . Drug Use: No  . Sexual Activity: Not on file   Other Topics Concern  . Not on file   Social History Narrative   Widowed twice then divorced   One son      No living will   Would have son be health care POA if needed   Not sure about DNR-- very adamant about not being kept on life support   No feeding tube!!   Review of Systems  Sleeps well--and better after ER visit Appetite is good Just typical family aggravations--- no sig mood issues     Objective:   Physical Exam  Constitutional: She appears well-developed and well-nourished. No distress.  Neck: Normal range of motion. Neck supple. No thyromegaly present.  Cardiovascular: Normal rate, regular rhythm and normal heart sounds.  Exam reveals no gallop.   No murmur heard. Pulmonary/Chest: Effort normal and breath sounds normal. No respiratory distress. She has no wheezes. She has no rales.  Musculoskeletal: She exhibits no edema.  Lymphadenopathy:    She has no cervical adenopathy.          Assessment & Plan:

## 2014-06-13 NOTE — Patient Instructions (Signed)
Please cut your metoprolol in half and take 50mg  twice a day--till they run out. Then start the new prescription. Start the losartan daily --today Take your blood pressure no more than 2-3 times per week---- after sitting for at least 5 minutes and not right after activity or coffee. Bring your cuff in with you to the next visit.

## 2014-06-13 NOTE — Assessment & Plan Note (Signed)
BP Readings from Last 3 Encounters:  06/13/14 128/80  06/11/14 138/65  12/05/13 138/84   Recheck 160/72 on right Will have her split the metoprolol to 50 bid Add low dose losartan (she prefers no diuretic) Follow up ~1 month

## 2014-06-13 NOTE — Progress Notes (Signed)
Pre visit review using our clinic review tool, if applicable. No additional management support is needed unless otherwise documented below in the visit note. 

## 2014-07-13 ENCOUNTER — Encounter: Payer: Self-pay | Admitting: Internal Medicine

## 2014-07-13 ENCOUNTER — Ambulatory Visit (INDEPENDENT_AMBULATORY_CARE_PROVIDER_SITE_OTHER): Payer: Medicare Other | Admitting: Internal Medicine

## 2014-07-13 VITALS — BP 142/80 | HR 63 | Temp 98.3°F | Wt 196.0 lb

## 2014-07-13 DIAGNOSIS — I10 Essential (primary) hypertension: Secondary | ICD-10-CM | POA: Diagnosis not present

## 2014-07-13 NOTE — Progress Notes (Signed)
   Subjective:    Patient ID: Kathryn Lopez, female    DOB: 02-Dec-1940, 74 y.o.   MRN: [id number]  HPI Here for follow up of hypertension Still has some anxiety---sister drove her here  BP at home much better Mostly 115-120 systolic No headaches No throat symptoms or cough No dizziness or syncope  No apparent problems with the new med  Current Outpatient Prescriptions on File Prior to Visit  Medication Sig Dispense Refill  . acetaminophen (TYLENOL) 325 MG tablet Take 650 mg by mouth 3 (three) times daily as needed for mild pain.    . cholecalciferol (VITAMIN D) 1000 UNITS tablet Take 1,000 Units by mouth daily.    Marland Kitchen. losartan (COZAAR) 50 MG tablet Take 1 tablet (50 mg total) by mouth daily. 90 tablet 3  . metoprolol (LOPRESSOR) 50 MG tablet Take 1 tablet (50 mg total) by mouth 2 (two) times daily. 180 tablet 3  . simvastatin (ZOCOR) 20 MG tablet Take 1 tablet (20 mg total) by mouth daily. 90 tablet 3   No current facility-administered medications on file prior to visit.    No Known Allergies  Past Medical History  Diagnosis Date  . Hyperlipidemia   . Hypertension   . Osteoarthritis, multiple sites     Past Surgical History  Procedure Laterality Date  . Appendectomy    . Tonsillectomy    . Cataract extraction w/ intraocular lens  implant, bilateral Bilateral 2012    Family History  Problem Relation Age of Onset  . Stroke Father   . Hypertension Sister   . Heart disease Sister     heart valve replaced  . Diabetes Brother   . Hypertension Brother   . Cancer Maternal Grandmother     breast cancer  . Diabetes Brother   . Hypertension Brother   . Hypertension Sister   . Hypertension Sister   . Hypertension Sister     History   Social History  . Marital Status: Divorced    Spouse Name: N/A  . Number of Children: 1  . 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     Social History Main Topics  . Smoking  status: Never Smoker   . Smokeless tobacco: Never Used  . Alcohol Use: No  . Drug Use: No  . Sexual Activity: Not on file   Other Topics Concern  . Not on file   Social History Narrative   Widowed twice then divorced   One son      No living will   Would have son be health care POA if needed   Not sure about DNR-- very adamant about not being kept on life support   No feeding tube!!   Review of Systems  Appetite is good Sleeping well     Objective:   Physical Exam  Constitutional: She appears well-developed and well-nourished. No distress.  Cardiovascular: Normal rate, regular rhythm and normal heart sounds.  Exam reveals no gallop.   No murmur heard. Pulmonary/Chest: Effort normal and breath sounds normal. No respiratory distress. She has no wheezes. She has no rales.  Musculoskeletal: She exhibits no edema.          Assessment & Plan:

## 2014-07-13 NOTE — Progress Notes (Signed)
Pre visit review using our clinic review tool, if applicable. No additional management support is needed unless otherwise documented below in the visit note. 

## 2014-07-13 NOTE — Assessment & Plan Note (Signed)
BP Readings from Last 3 Encounters:  07/13/14 142/80  06/13/14 128/80  06/11/14 138/65   After nerves with sister's driving--- BP up to 161190 and 200 systolic. Her cuff correlated with my measurements Mostly excellent control at home ---most under 120 Will continue the losartan

## 2014-07-14 ENCOUNTER — Encounter: Payer: Self-pay | Admitting: *Deleted

## 2014-07-14 LAB — RENAL FUNCTION PANEL
Albumin: 4.1 g/dL (ref 3.5–5.2)
BUN: 14 mg/dL (ref 6–23)
CHLORIDE: 100 meq/L (ref 96–112)
CO2: 30 meq/L (ref 19–32)
CREATININE: 1.08 mg/dL (ref 0.40–1.20)
Calcium: 9.4 mg/dL (ref 8.4–10.5)
GFR: 52.47 mL/min — AB (ref >60.00)
Glucose, Bld: 77 mg/dL (ref 70–99)
PHOSPHORUS: 4.3 mg/dL (ref 2.3–4.6)
Potassium: 4.6 mEq/L (ref 3.5–5.1)
SODIUM: 138 meq/L (ref 135–145)

## 2014-07-24 DIAGNOSIS — Z1231 Encounter for screening mammogram for malignant neoplasm of breast: Secondary | ICD-10-CM | POA: Diagnosis not present

## 2014-07-26 ENCOUNTER — Encounter: Payer: Self-pay | Admitting: *Deleted

## 2014-09-13 ENCOUNTER — Ambulatory Visit (INDEPENDENT_AMBULATORY_CARE_PROVIDER_SITE_OTHER)
Admission: RE | Admit: 2014-09-13 | Discharge: 2014-09-13 | Disposition: A | Discharge status: Home / Self Care | Payer: Medicare Other | Source: Ambulatory Visit | Attending: Family Medicine | Admitting: Family Medicine

## 2014-09-13 ENCOUNTER — Ambulatory Visit: Payer: Self-pay | Admitting: Internal Medicine

## 2014-09-13 ENCOUNTER — Encounter: Payer: Self-pay | Admitting: Family Medicine

## 2014-09-13 ENCOUNTER — Ambulatory Visit (INDEPENDENT_AMBULATORY_CARE_PROVIDER_SITE_OTHER): Payer: Medicare Other | Admitting: Family Medicine

## 2014-09-13 VITALS — BP 136/80 | HR 59 | Temp 97.5°F | Ht 63.5 in | Wt 198.0 lb

## 2014-09-13 DIAGNOSIS — M25562 Pain in left knee: Secondary | ICD-10-CM

## 2014-09-13 NOTE — Progress Notes (Signed)
Subjective:    Patient ID: Kathryn Lopez, female    DOB: Dec 12, 1940, 74 y.o.   MRN: [id number]  HPI Here for L leg pain   Pain in lower left leg from knee down   She went bowling a month ago - stumbled but did not fall - started then  Got better and then became worse again  Using a cane (using 2 of them) Very painful to bear weight  No swelling or bruising  Not tender  Describes as a burning pain  Sometimes it hurts at rest (not always)  Comfortable in bed until she moves    Has "broken veins" all of her life    Has not seen any other doctors for this   Thinks she has arthritis in her knees - does not think she had an xray  She takes tylenol as needed    Patient Active Problem List   Diagnosis Date Noted  . Preventative health care 12/05/2013  . Acute bronchitis 06/16/2013  . Hyperlipidemia   . Hypertension   . Osteoarthritis, multiple sites    Past Medical History  Diagnosis Date  . Hyperlipidemia   . Hypertension   . Osteoarthritis, multiple sites    Past Surgical History  Procedure Laterality Date  . Appendectomy    . Tonsillectomy    . Cataract extraction w/ intraocular lens  implant, bilateral Bilateral 2012   Social History  Substance Use Topics  . Smoking status: Never Smoker   . Smokeless tobacco: Never Used  . Alcohol Use: No   Family History  Problem Relation Age of Onset  . Stroke Father   . Hypertension Sister   . Heart disease Sister     heart valve replaced  . Diabetes Brother   . Hypertension Brother   . Cancer Maternal Grandmother     breast cancer  . Diabetes Brother   . Hypertension Brother   . Hypertension Sister   . Hypertension Sister   . Hypertension Sister    No Known Allergies Current Outpatient Prescriptions on File Prior to Visit  Medication Sig Dispense Refill  . acetaminophen (TYLENOL) 325 MG tablet Take 650 mg by mouth 3 (three) times daily as needed for mild pain.    . cholecalciferol (VITAMIN D) 1000  UNITS tablet Take 1,000 Units by mouth daily.    Marland Kitchen losartan (COZAAR) 50 MG tablet Take 1 tablet (50 mg total) by mouth daily. 90 tablet 3  . metoprolol (LOPRESSOR) 50 MG tablet Take 1 tablet (50 mg total) by mouth 2 (two) times daily. 180 tablet 3  . simvastatin (ZOCOR) 20 MG tablet Take 1 tablet (20 mg total) by mouth daily. 90 tablet 3   No current facility-administered medications on file prior to visit.      Review of Systems Review of Systems  Constitutional: Negative for fever, appetite change, fatigue and unexpected weight change.  Eyes: Negative for pain and visual disturbance.  Respiratory: Negative for cough and shortness of breath.   Cardiovascular: Negative for cp or palpitations    Gastrointestinal: Negative for nausea, diarrhea and constipation.  Genitourinary: Negative for urgency and frequency.  Skin: Negative for pallor or rash   MSK pos for knee/L lower leg pain, neg for swelling or bruising  Neurological: Negative for weakness, light-headedness, numbness and headaches.  Hematological: Negative for adenopathy. Does not bruise/bleed easily.  Psychiatric/Behavioral: Negative for dysphoric mood. The patient is not nervous/anxious.         Objective:   Physical  Exam  Constitutional: She appears well-developed and well-nourished. No distress.  obese and well appearing   HENT:  Head: Normocephalic and atraumatic.  Mouth/Throat: Oropharynx is clear and moist.  Eyes: Conjunctivae and EOM are normal. Pupils are equal, round, and reactive to light.  Neck: Normal range of motion. Neck supple.  Cardiovascular: Normal rate and regular rhythm.   Some LE varicosities/small  Pulmonary/Chest: Effort normal and breath sounds normal.  Musculoskeletal: She exhibits tenderness. She exhibits no edema.       Left knee: She exhibits decreased range of motion. She exhibits no swelling, no effusion, no ecchymosis, no deformity, no laceration, no erythema, normal alignment, no LCL  laxity, normal patellar mobility, no bony tenderness and no MCL laxity. Tenderness found. Medial joint line and MCL tenderness noted.  Pos Mc murray test  Pain on full extension and bounce No eff or swelling Some discomfort with flex past 90 deg  Tenderness in post knee   Pt can bear partial wt   Mild tenderness if calf just under knee w/o swelling/erythema/warmth or palp cord Neg homan's sign       Lymphadenopathy:    She has no cervical adenopathy.  Neurological: She is alert. She has normal reflexes. She displays no atrophy. No cranial nerve deficit or sensory deficit. She exhibits normal muscle tone. Coordination and gait normal.  Skin: Skin is warm and dry. No rash noted. No erythema. No pallor.  Psychiatric: She has a normal mood and affect.          Assessment & Plan:   Problem List Items Addressed This Visit      Other   Left knee pain - Primary    Worse recently - quite painful to bear weight on  No acute injury or swelling  Pain is posterior and medial- OA/ meniscal injury and bakers cyst are all in the differential  inst to take acetaminophen more regularly for pain as needed  Ice/ elevate  XR now and plan from there       Relevant Orders   DG Knee Complete 4 Views Left (Completed)

## 2014-09-13 NOTE — Progress Notes (Signed)
Pre visit review using our clinic review tool, if applicable. No additional management support is needed unless otherwise documented below in the visit note. 

## 2014-09-13 NOTE — Patient Instructions (Signed)
I think something may be going on with your knee  Elevate your leg and knee whenever you can and try cold compress for 10 minutes at a time  You can take 2 regular tylenol up to every 4-6 hours  We will make a plan when we get the xray reading

## 2014-09-14 ENCOUNTER — Other Ambulatory Visit: Payer: Self-pay | Admitting: *Deleted

## 2014-09-14 MED ORDER — METOPROLOL TARTRATE 50 MG PO TABS: 50.0000 mg | ORAL_TABLET | Freq: Two times a day (BID) | ORAL | Status: DC

## 2014-09-14 MED ORDER — LOSARTAN POTASSIUM 50 MG PO TABS: 50.0000 mg | ORAL_TABLET | Freq: Every day | ORAL | Status: DC

## 2014-09-14 MED ORDER — SIMVASTATIN 20 MG PO TABS: 20.0000 mg | ORAL_TABLET | Freq: Every day | ORAL | Status: DC

## 2014-09-14 NOTE — Assessment & Plan Note (Signed)
Worse recently - quite painful to bear weight on  No acute injury or swelling  Pain is posterior and medial- OA/ meniscal injury and bakers cyst are all in the differential  inst to take acetaminophen more regularly for pain as needed  Ice/ elevate  XR now and plan from there

## 2014-09-21 ENCOUNTER — Ambulatory Visit (INDEPENDENT_AMBULATORY_CARE_PROVIDER_SITE_OTHER): Payer: Medicare Other | Admitting: Family Medicine

## 2014-09-21 ENCOUNTER — Encounter: Payer: Self-pay | Admitting: Family Medicine

## 2014-09-21 VITALS — BP 154/70 | HR 56 | Temp 98.4°F | Ht 63.5 in | Wt 193.8 lb

## 2014-09-21 DIAGNOSIS — M25562 Pain in left knee: Secondary | ICD-10-CM

## 2014-09-21 NOTE — Progress Notes (Signed)
Dr. Karleen Hampshire T. Aneri Slagel, MD, CAQ Sports Medicine [facility] and Sports Medicine [address] [place], [address] Phone: [phone] Fax: [phone]  09/21/2014  Patient: Kathryn Lopez, MRN: [id number], DOB: [birth year], 74 y.o.  Primary Physician:  Tillman Abide, MD  Chief Complaint: Knee Pain  Subjective:   Kathryn Lopez is a 74 y.o. very pleasant female patient who presents with the following:  The patient reports an injury 1 month ago when she was bowling with her family and when she was moving her left leg stopped and then she started to get some pain in her knee.  Initially she did have some swelling, and she continues to have some swelling with movement.  Pain with movement.  She is not having any mechanical symptoms at all.  She was at first walking with 2 canes to get around, and now she is only using 1, and she subjectively thinks that she has improved over about a month's time.  No significant prior injury to that knee.  Was walking with 2 canes, now doing better.  Left leg, hurting.  Knee.   Hurt to walk after stopped   Past Medical History, Surgical History, Social History, Family History, Problem List, Medications, and Allergies have been reviewed and updated if relevant.  Patient Active Problem List   Diagnosis Date Noted  . Left knee pain 09/13/2014  . Preventative health care 12/05/2013  . Acute bronchitis 06/16/2013  . Hyperlipidemia   . Hypertension   . Osteoarthritis, multiple sites     Past Medical History  Diagnosis Date  . Hyperlipidemia   . Hypertension   . Osteoarthritis, multiple sites     Past Surgical History  Procedure Laterality Date  . Appendectomy    . Tonsillectomy    . Cataract extraction w/ intraocular lens  implant, bilateral Bilateral 2012    Social History   Social History  . Marital Status: Divorced    Spouse Name: N/A  . Number of Children: 1  . 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     Social History Main Topics  . Smoking status: Never Smoker   . Smokeless tobacco: Never Used  . Alcohol Use: No  . Drug Use: No  . Sexual Activity: Not on file   Other Topics Concern  . Not on file   Social History Narrative   Widowed twice then divorced   One son      No living will   Would have son be health care POA if needed   Not sure about DNR-- very adamant about not being kept on life support   No feeding tube!!    Family History  Problem Relation Age of Onset  . Stroke Father   . Hypertension Sister   . Heart disease Sister     heart valve replaced  . Diabetes Brother   . Hypertension Brother   . Cancer Maternal Grandmother     breast cancer  . Diabetes Brother   . Hypertension Brother   . Hypertension Sister   . Hypertension Sister   . Hypertension Sister     No Known Allergies  Medication list reviewed and updated in full in [facility].  GEN: No fevers, chills. Nontoxic. Primarily MSK c/o today. MSK: Detailed in the HPI GI: tolerating PO intake without difficulty Neuro: No numbness, parasthesias, or tingling associated. Otherwise the pertinent positives of the  ROS are noted above.   Objective:   BP 154/70 mmHg  Pulse 56  Temp(Src) 98.4 F (36.9 C) (Oral)  Ht 5' 3.5" (1.613 m)  Wt 193 lb 12 oz (87.884 kg)  BMI 33.78 kg/m2   GEN: WDWN, NAD, Non-toxic, Alert & Oriented x 3 HEENT: Atraumatic, Normocephalic.  Ears and Nose: No external deformity. EXTR: No clubbing/cyanosis/edema NEURO: Normal gait. antalgia PSYCH: Normally interactive. Conversant. Not depressed or anxious appearing.  Calm demeanor.   Knee:  L ROM: 0-100 Effusion: neg Echymosis or edema: none Patellar tendon NT Painful PLICA: neg Patellar grind: negative Medial and lateral patellar facet loading: negative medial and lateral joint lines: medial joint line tenderness Mcmurray's pain Flexion-pinch pain Varus and valgus  stress: stable Lachman: neg Ant and Post drawer: neg Hip abduction, IR, ER: WNL Hip flexion str: 5/5 Hip abd: 5/5 Quad: 5/5 VMO atrophy:No Hamstring concentric and eccentric: 5/5   Radiology: Dg Knee Complete 4 Views Left  09/13/2014   CLINICAL DATA:  pain in L knee- posterior and medial/ pain to bear weight/no swelling- ( pt was bowling and her foot stuck to the floor which injured her knee).  EXAM: LEFT KNEE - COMPLETE 4+ VIEW  COMPARISON:  None.  FINDINGS: Minimal narrowing and osteophyte formation medial and lateral compartments. No fracture or dislocation. Mild similar arthritic change patellofemoral compartment. No joint effusion.  IMPRESSION: Mild tricompartmental arthritis   Electronically Signed   By: Esperanza Heir M.D.   On: 09/13/2014 18:56   The radiological images were independently reviewed by myself in the office and results were reviewed with the patient. My independent interpretation of images:  4 view nonweightbearing trauma series reviewed by myself.  There is minimal joint space loss in the medial and lateral compartments.  There is a very small osteophyte at the patellofemoral joint with minimal degenerative changes.  Overall, I would classes minimal to mild osteoarthritic change overall. Electronically Signed  By: Hannah Beat, MD On: 09/22/2014 5:24 PM   Assessment and Plan:   Left knee pain   >25 minutes spent in face to face time with patient, >50% spent in counselling or coordination of care: We reviewed all films. Using an anatomical model, I reviewed with the patient the structures involved and how they related to their diagnosis . Minimal osteoarthritic change.  Acute injury 1 month ago while bowling.  More likely, this represents a meniscal injury.  Potential bone bruising or other injury would certainly be possible.  We discussed various treatment options, and the patient would like to be as conservative as possible and give this a few more months, which is  certainly reasonable.  She is going to continue with some Tylenol and occasional NSAIDs.  Continue ice and continue using her cane.  Follow-up in 2-3 months if still having symptoms.  Signed,  Elpidio Galea. Nyari Olsson, MD   Patient's Medications  New Prescriptions   No medications on file  Previous Medications   ACETAMINOPHEN (TYLENOL) 325 MG TABLET    Take 650 mg by mouth 3 (three) times daily as needed for mild pain.   CHOLECALCIFEROL (VITAMIN D) 1000 UNITS TABLET    Take 1,000 Units by mouth daily.   LOSARTAN (COZAAR) 50 MG TABLET    Take 1 tablet (50 mg total) by mouth daily.   METOPROLOL (LOPRESSOR) 50 MG TABLET    Take 1 tablet (50 mg total) by mouth 2 (two) times daily.   SIMVASTATIN (ZOCOR) 20 MG TABLET    Take 1 tablet (  20 mg total) by mouth daily.  Modified Medications   No medications on file  Discontinued Medications   No medications on file

## 2014-09-21 NOTE — Progress Notes (Signed)
Pre visit review using our clinic review tool, if applicable. No additional management support is needed unless otherwise documented below in the visit note. 

## 2014-11-01 ENCOUNTER — Ambulatory Visit (INDEPENDENT_AMBULATORY_CARE_PROVIDER_SITE_OTHER): Payer: Medicare Other | Admitting: Family Medicine

## 2014-11-01 ENCOUNTER — Ambulatory Visit (INDEPENDENT_AMBULATORY_CARE_PROVIDER_SITE_OTHER)
Admission: RE | Admit: 2014-11-01 | Discharge: 2014-11-01 | Disposition: A | Discharge status: Home / Self Care | Payer: Medicare Other | Source: Ambulatory Visit | Attending: Family Medicine | Admitting: Family Medicine

## 2014-11-01 ENCOUNTER — Encounter: Payer: Self-pay | Admitting: Family Medicine

## 2014-11-01 VITALS — BP 149/85 | HR 62 | Temp 97.7°F | Ht 63.5 in | Wt 192.5 lb

## 2014-11-01 DIAGNOSIS — M179 Osteoarthritis of knee, unspecified: Secondary | ICD-10-CM | POA: Diagnosis not present

## 2014-11-01 DIAGNOSIS — M25562 Pain in left knee: Secondary | ICD-10-CM

## 2014-11-01 NOTE — Patient Instructions (Signed)

## 2014-11-01 NOTE — Progress Notes (Signed)
Dr. Karleen Hampshire T. Xandrea Clarey, MD, CAQ Sports Medicine [facility] and Sports Medicine [address] [place], [address] Phone: [phone] Fax: [phone]  11/01/2014  Patient: Kathryn Lopez, MRN: [id number], DOB: 06-Sep-1940, 74 y.o.  Primary Physician:  Tillman Abide, MD  Chief Complaint: Follow-up  Subjective:   Kathryn Lopez is a 74 y.o. very pleasant female patient who presents with the following:  Got where could not walk on Monday. 10/10 pain now - now using a walker and having a hard time walking.  She is not sure exactly what she did, but on Monday she went from being relatively tolerable unable to walk around with only a cane sometimes to having 10 out of 10 pain, and now she is requiring a walker to get around her house.  Today in the office she is using either a walker or a wheelchair.  She has a markedly altered gait and is markedly worse compared to her prior examination.  This is all started within the last 3 days.  No distinct trauma.  09/21/2014 Last OV with Hannah Beat, MD  The patient reports an injury 1 month ago when she was bowling with her family and when she was moving her left leg stopped and then she started to get some pain in her knee.  Initially she did have some swelling, and she continues to have some swelling with movement.  Pain with movement.  She is not having any mechanical symptoms at all.  She was at first walking with 2 canes to get around, and now she is only using 1, and she subjectively thinks that she has improved over about a month's time.  No significant prior injury to that knee.  Was walking with 2 canes, now doing better.  Left leg, hurting.  Knee.   Hurt to walk after stopped   Past Medical History, Surgical History, Social History, Family History, Problem List, Medications, and Allergies have been reviewed and updated if relevant.  Patient Active Problem List   Diagnosis Date Noted  . Left knee pain 09/13/2014    . Preventative health care 12/05/2013  . Acute bronchitis 06/16/2013  . Hyperlipidemia   . Hypertension   . Osteoarthritis, multiple sites     Past Medical History  Diagnosis Date  . Hyperlipidemia   . Hypertension   . Osteoarthritis, multiple sites     Past Surgical History  Procedure Laterality Date  . Appendectomy    . Tonsillectomy    . Cataract extraction w/ intraocular lens  implant, bilateral Bilateral 2012    Social History   Social History  . Marital Status: Divorced    Spouse Name: N/A  . Number of Children: 1  . 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     Social History Main Topics  . Smoking status: Never Smoker   . Smokeless tobacco: Never Used  . Alcohol Use: No  . Drug Use: No  . Sexual Activity: Not on file   Other Topics Concern  . Not on file   Social History Narrative   Widowed twice then divorced   One son      No living will   Would have son be health care POA if needed   Not sure about DNR-- very adamant about not being kept on life support   No feeding tube!!    Family History  Problem Relation Age of Onset  .  Stroke Father   . Hypertension Sister   . Heart disease Sister     heart valve replaced  . Diabetes Brother   . Hypertension Brother   . Cancer Maternal Grandmother     breast cancer  . Diabetes Brother   . Hypertension Brother   . Hypertension Sister   . Hypertension Sister   . Hypertension Sister     No Known Allergies  Medication list reviewed and updated in full in [facility].  GEN: No fevers, chills. Nontoxic. Primarily MSK c/o today. MSK: Detailed in the HPI GI: tolerating PO intake without difficulty Neuro: No numbness, parasthesias, or tingling associated. Otherwise the pertinent positives of the ROS are noted above.   Objective:   BP 149/85 mmHg  Pulse 62  Temp(Src) 97.7 F (36.5 C) (Oral)  Ht 5' 3.5" (1.613 m)  Wt 192 lb 8 oz (87.317 kg)   BMI 33.56 kg/m2   GEN: WDWN, NAD, Non-toxic, Alert & Oriented x 3 HEENT: Atraumatic, Normocephalic.  Ears and Nose: No external deformity. EXTR: No clubbing/cyanosis/edema NEURO: markedly abnormal, either using walker or wheelchair in office PSYCH: Normally interactive. Conversant. Not depressed or anxious appearing.  Calm demeanor.   Knee:  L ROM: 0-90 Effusion: mild Echymosis or edema: none Patellar tendon NT Painful PLICA: neg Patellar grind: negative Medial and lateral patellar facet loading: negative medial and lateral joint lines: medial joint line tenderness > lateral and TTP on tibial plateau Mcmurray's + Flexion-pinch + Varus and valgus stress: stable Lachman: neg Ant and Post drawer: neg Hip abduction, IR, ER: WNL Hip flexion str: 5/5 Hip abd: 5/5 Quad: 4/5 VMO atrophy:y Hamstring concentric and eccentric: 5/5   Radiology:  Dg Knee Complete 4 Views Left  09/13/2014  CLINICAL DATA:  pain in L knee- posterior and medial/ pain to bear weight/no swelling- ( pt was bowling and her foot stuck to the floor which injured her knee). EXAM: LEFT KNEE - COMPLETE 4+ VIEW COMPARISON:  None. FINDINGS: Minimal narrowing and osteophyte formation medial and lateral compartments. No fracture or dislocation. Mild similar arthritic change patellofemoral compartment. No joint effusion. IMPRESSION: Mild tricompartmental arthritis Electronically Signed   By: Esperanza Heir M.D.   On: 09/13/2014 18:56   Dg Knee Complete 4 Views Left  11/01/2014  CLINICAL DATA:  Worsening left knee pain. EXAM: LEFT KNEE - COMPLETE 4+ VIEW COMPARISON:  09/13/2014 FINDINGS: No evidence of acute fracture or subluxation. No focal bone lesion or bone destruction. There are mild changes of 3 compartment osteoarthritis, with joint space narrowing and small osteophyte formation. There is no evidence of large suprapatellar joint effusion. IMPRESSION: Mild 3 compartment osteoarthritis of the right knee. No evidence of  acute abnormality. Electronically Signed   By: Ted Mcalpine M.D.   On: 11/01/2014 16:27    Assessment and Plan:   Left knee pain - Plan: DG Knee Complete 4 Views Left, MR Knee Left  Wo Contrast  Obtain an MRI of the left knee to evaluate for unseen internal derangement.  Plain films are normal.  Patient has transitioned from fairly okay tolerable amount of pain to 10 out of 10 pain in a  Rapid time span.  MRI to differentiate internal derangement including tibial plateau fracture not seen on plain films, tibial plateau stress fracture, ACL rupture, large meniscal tear, or other internal derangement.  Please see my examination, and the patient now is requiring either a walker or wheelchair where 4 days previous she was only using a cane intermittently.  Orders Placed This Encounter  Procedures  . DG Knee Complete 4 Views Left  . MR Knee Left  Wo Contrast    Signed,  Deloyce Walthers T. Roxsana Riding, MD   Patient's Medications  New Prescriptions   No medications on file  Previous Medications   ACETAMINOPHEN (TYLENOL) 325 MG TABLET    Take 650 mg by mouth 3 (three) times daily as needed for mild pain.   CHOLECALCIFEROL (VITAMIN D) 1000 UNITS TABLET    Take 1,000 Units by mouth daily.   LOSARTAN (COZAAR) 50 MG TABLET    Take 1 tablet (50 mg total) by mouth daily.   METOPROLOL (LOPRESSOR) 50 MG TABLET    Take 1 tablet (50 mg total) by mouth 2 (two) times daily.   SIMVASTATIN (ZOCOR) 20 MG TABLET    Take 1 tablet (20 mg total) by mouth daily.  Modified Medications   No medications on file  Discontinued Medications   No medications on file

## 2014-11-01 NOTE — Progress Notes (Signed)
Pre visit review using our clinic review tool, if applicable. No additional management support is needed unless otherwise documented below in the visit note. 

## 2014-11-08 ENCOUNTER — Ambulatory Visit
Admission: RE | Admit: 2014-11-08 | Discharge: 2014-11-08 | Disposition: A | Discharge status: Home / Self Care | Payer: Medicare Other | Source: Ambulatory Visit | Attending: Family Medicine | Admitting: Family Medicine

## 2014-11-08 ENCOUNTER — Other Ambulatory Visit: Payer: Self-pay | Admitting: Family Medicine

## 2014-11-08 DIAGNOSIS — S83242D Other tear of medial meniscus, current injury, left knee, subsequent encounter: Secondary | ICD-10-CM

## 2014-11-08 DIAGNOSIS — S83232A Complex tear of medial meniscus, current injury, left knee, initial encounter: Secondary | ICD-10-CM | POA: Diagnosis not present

## 2014-11-08 DIAGNOSIS — M25562 Pain in left knee: Secondary | ICD-10-CM

## 2014-11-13 DIAGNOSIS — M25562 Pain in left knee: Secondary | ICD-10-CM | POA: Diagnosis not present

## 2014-12-07 ENCOUNTER — Ambulatory Visit (INDEPENDENT_AMBULATORY_CARE_PROVIDER_SITE_OTHER): Payer: Medicare Other | Admitting: Internal Medicine

## 2014-12-07 ENCOUNTER — Encounter: Payer: Self-pay | Admitting: Internal Medicine

## 2014-12-07 VITALS — BP 120/80 | HR 60 | Temp 97.7°F | Ht 64.0 in | Wt 189.0 lb

## 2014-12-07 DIAGNOSIS — Z7189 Other specified counseling: Secondary | ICD-10-CM | POA: Insufficient documentation

## 2014-12-07 DIAGNOSIS — E785 Hyperlipidemia, unspecified: Secondary | ICD-10-CM | POA: Diagnosis not present

## 2014-12-07 DIAGNOSIS — I10 Essential (primary) hypertension: Secondary | ICD-10-CM

## 2014-12-07 DIAGNOSIS — Z23 Encounter for immunization: Secondary | ICD-10-CM

## 2014-12-07 DIAGNOSIS — Z0000 Encounter for general adult medical examination without abnormal findings: Secondary | ICD-10-CM

## 2014-12-07 NOTE — Progress Notes (Signed)
Subjective:    Patient ID: Kathryn Lopez, female    DOB: [birth year], 74 y.o.   MRN: [id number]  HPI  Here for Medicare wellness and follow up of chronic medical conditions Reviewed form and advanced directives Reviewed other physicians No tobacco or alcohol Tries to walk No falls--but close when Kathryn Lopez hurt her knee Vision and hearing are okay Independent with instrumental ADLs No apparent memory problems  Now seeing Dr Jerl Santos for torn meniscus Considering arthroscopy but not sure Uses cane when walking now--- mostly in house though now Using tylenol  BP is excellent  No problems with meds No chest pain, SOB, dizziness, edema No palpitations  No myalgias or GI problems on statin Satisfied with still using this  Current Outpatient Prescriptions on File Prior to Visit  Medication Sig Dispense Refill  . acetaminophen (TYLENOL) 325 MG tablet Take 650 mg by mouth 3 (three) times daily as needed for mild pain.    . cholecalciferol (VITAMIN D) 1000 UNITS tablet Take 1,000 Units by mouth daily.    Marland Kitchen losartan (COZAAR) 50 MG tablet Take 1 tablet (50 mg total) by mouth daily. 90 tablet 3  . metoprolol (LOPRESSOR) 50 MG tablet Take 1 tablet (50 mg total) by mouth 2 (two) times daily. 180 tablet 3  . simvastatin (ZOCOR) 20 MG tablet Take 1 tablet (20 mg total) by mouth daily. 90 tablet 3   No current facility-administered medications on file prior to visit.    No Known Allergies  Past Medical History  Diagnosis Date  . Hyperlipidemia   . Hypertension   . Osteoarthritis, multiple sites     Past Surgical History  Procedure Laterality Date  . Appendectomy    . Tonsillectomy    . Cataract extraction w/ intraocular lens  implant, bilateral Bilateral 2012    Family History  Problem Relation Age of Onset  . Stroke Father   . Hypertension Sister   . Heart disease Sister     heart valve replaced  . Diabetes Brother   . Hypertension Brother   . Cancer Maternal  Grandmother     breast cancer  . Diabetes Brother   . Hypertension Brother   . Hypertension Sister   . Hypertension Sister   . Hypertension Sister     Social History   Social History  . Marital Status: Divorced    Spouse Name: N/A  . Number of Children: 1  . 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     Social History Main Topics  . Smoking status: Never Smoker   . Smokeless tobacco: Never Used  . Alcohol Use: No  . Drug Use: No  . Sexual Activity: Not on file   Other Topics Concern  . Not on file   Social History Narrative   Widowed twice then divorced   One son      No living will   Would have son be health care POA if needed   Not sure about DNR-- very adamant about not being kept on life support   No feeding tube!!   Review of Systems Full dentures--no problems Appetite is good--weight is down 4# Wears seat belt Usually sleeps okay Voids okay-- nocturia x 1-2 Bowels are slower-- uses stool softeners at times No rash or suspicious skin lesions No other joint problems. Mild occasional back pain    Objective:   Physical Exam  Constitutional: Kathryn Lopez is oriented  to person, place, and time. Kathryn Lopez appears well-developed and well-nourished. No distress.  HENT:  Mouth/Throat: Oropharynx is clear and moist. No oropharyngeal exudate.  Neck: Normal range of motion. Neck supple. No thyromegaly present.  Cardiovascular: Normal rate, regular rhythm, normal heart sounds and intact distal pulses.  Exam reveals no gallop.   No murmur heard. Pulmonary/Chest: Effort normal and breath sounds normal. No respiratory distress. Kathryn Lopez has no wheezes. Kathryn Lopez has no rales.  Abdominal: Soft. There is no tenderness.  Musculoskeletal: Kathryn Lopez exhibits no edema or tenderness.  Left knee McMurray's only slightly positive  Lymphadenopathy:    Kathryn Lopez has no cervical adenopathy.  Neurological: Kathryn Lopez is alert and oriented to person, place, and time.    President-- "Obama, Bush, Clinton" [phone] Recall 3/3  Skin: No rash noted. No erythema.  Psychiatric: Kathryn Lopez has a normal mood and affect. Her behavior is normal.          Assessment & Plan:

## 2014-12-07 NOTE — Assessment & Plan Note (Signed)
BP Readings from Last 3 Encounters:  12/07/14 120/80  11/01/14 149/85  09/21/14 154/70   Good control No changes

## 2014-12-07 NOTE — Progress Notes (Signed)
Pre visit review using our clinic review tool, if applicable. No additional management support is needed unless otherwise documented below in the visit note. 

## 2014-12-07 NOTE — Assessment & Plan Note (Signed)
I have personally reviewed the Medicare Annual Wellness questionnaire and have noted 1. The patient's medical and social history 2. Their use of alcohol, tobacco or illicit drugs 3. Their current medications and supplements 4. The patient's functional ability including ADL's, fall risks, home safety risks and hearing or visual             impairment. 5. Diet and physical activities 6. Evidence for depression or mood disorders  The patients weight, height, BMI and visual acuity have been recorded in the chart I have made referrals, counseling and provided education to the patient based review of the above and I have provided the pt with a written personalized care plan for preventive services.  I have provided you with a copy of your personalized plan for preventive services. Please take the time to review along with your updated medication list.  Mammogram due 2018--she wants to continue No colon unless symptoms Flu vaccine today No zostavax due to cost

## 2014-12-07 NOTE — Assessment & Plan Note (Signed)
See social history She has blank forms

## 2014-12-07 NOTE — Assessment & Plan Note (Signed)
Satisfied with the primary prevention Check labs

## 2014-12-07 NOTE — Addendum Note (Signed)
Addended by: Sueanne MargaritaSMITH, DESHANNON L on: 12/07/2014 03:38 PM   Modules accepted: Orders

## 2014-12-08 ENCOUNTER — Encounter: Payer: Self-pay | Admitting: *Deleted

## 2014-12-08 LAB — LIPID PANEL
CHOLESTEROL: 171 mg/dL (ref 0–200)
HDL: 47.1 mg/dL (ref >39.00)
LDL Cholesterol: 98 mg/dL (ref 0–99)
NONHDL: 123.91
Total CHOL/HDL Ratio: 4
Triglycerides: 131 mg/dL (ref 0.0–149.0)
VLDL: 26.2 mg/dL (ref 0.0–40.0)

## 2014-12-08 LAB — CBC WITH DIFFERENTIAL/PLATELET
BASOS PCT: 0.5 % (ref 0.0–3.0)
Basophils Absolute: 0 10*3/uL (ref 0.0–0.1)
EOS ABS: 0.1 10*3/uL (ref 0.0–0.7)
EOS PCT: 1.6 % (ref 0.0–5.0)
HCT: 43.4 % (ref 36.0–46.0)
HEMOGLOBIN: 14.4 g/dL (ref 12.0–15.0)
LYMPHS ABS: 1.9 10*3/uL (ref 0.7–4.0)
Lymphocytes Relative: 28.8 % (ref 12.0–46.0)
MCHC: 33.3 g/dL (ref 30.0–36.0)
MCV: 91.7 fl (ref 78.0–100.0)
MONO ABS: 0.6 10*3/uL (ref 0.1–1.0)
Monocytes Relative: 8.8 % (ref 3.0–12.0)
NEUTROS PCT: 60.3 % (ref 43.0–77.0)
Neutro Abs: 4.1 10*3/uL (ref 1.4–7.7)
Platelets: 231 10*3/uL (ref 150.0–400.0)
RBC: 4.73 Mil/uL (ref 3.87–5.11)
RDW: 13.9 % (ref 11.5–15.5)
WBC: 6.7 10*3/uL (ref 4.0–10.5)

## 2014-12-08 LAB — COMPREHENSIVE METABOLIC PANEL
ALK PHOS: 67 U/L (ref 39–117)
ALT: 26 U/L (ref 0–35)
AST: 23 U/L (ref 0–37)
Albumin: 4.4 g/dL (ref 3.5–5.2)
BUN: 17 mg/dL (ref 6–23)
CHLORIDE: 102 meq/L (ref 96–112)
CO2: 27 mEq/L (ref 19–32)
CREATININE: 1.21 mg/dL — AB (ref 0.40–1.20)
Calcium: 9.9 mg/dL (ref 8.4–10.5)
GFR: 45.97 mL/min — ABNORMAL LOW (ref >60.00)
Glucose, Bld: 90 mg/dL (ref 70–99)
Potassium: 4.9 mEq/L (ref 3.5–5.1)
SODIUM: 138 meq/L (ref 135–145)
Total Bilirubin: 0.5 mg/dL (ref 0.2–1.2)
Total Protein: 7.7 g/dL (ref 6.0–8.3)

## 2014-12-08 LAB — T4, FREE: FREE T4: 0.78 ng/dL (ref 0.60–1.60)

## 2014-12-22 DIAGNOSIS — M25562 Pain in left knee: Secondary | ICD-10-CM | POA: Diagnosis not present

## 2015-01-07 HISTORY — PX: KNEE ARTHROSCOPY: SUR90

## 2015-02-01 DIAGNOSIS — G8918 Other acute postprocedural pain: Secondary | ICD-10-CM | POA: Diagnosis not present

## 2015-02-01 DIAGNOSIS — S83242A Other tear of medial meniscus, current injury, left knee, initial encounter: Secondary | ICD-10-CM | POA: Diagnosis not present

## 2015-02-01 DIAGNOSIS — M23221 Derangement of posterior horn of medial meniscus due to old tear or injury, right knee: Secondary | ICD-10-CM | POA: Diagnosis not present

## 2015-02-01 DIAGNOSIS — M23262 Derangement of other lateral meniscus due to old tear or injury, left knee: Secondary | ICD-10-CM | POA: Diagnosis not present

## 2015-02-01 DIAGNOSIS — M1712 Unilateral primary osteoarthritis, left knee: Secondary | ICD-10-CM | POA: Diagnosis not present

## 2015-02-01 DIAGNOSIS — M94261 Chondromalacia, right knee: Secondary | ICD-10-CM | POA: Diagnosis not present

## 2015-02-02 ENCOUNTER — Other Ambulatory Visit: Payer: Self-pay | Admitting: Internal Medicine

## 2015-02-07 DIAGNOSIS — M6281 Muscle weakness (generalized): Secondary | ICD-10-CM | POA: Diagnosis not present

## 2015-02-07 DIAGNOSIS — M25562 Pain in left knee: Secondary | ICD-10-CM | POA: Diagnosis not present

## 2015-02-07 DIAGNOSIS — M25662 Stiffness of left knee, not elsewhere classified: Secondary | ICD-10-CM | POA: Diagnosis not present

## 2015-02-12 DIAGNOSIS — M25562 Pain in left knee: Secondary | ICD-10-CM | POA: Diagnosis not present

## 2015-02-12 DIAGNOSIS — M6281 Muscle weakness (generalized): Secondary | ICD-10-CM | POA: Diagnosis not present

## 2015-02-12 DIAGNOSIS — M25662 Stiffness of left knee, not elsewhere classified: Secondary | ICD-10-CM | POA: Diagnosis not present

## 2015-02-14 DIAGNOSIS — M25662 Stiffness of left knee, not elsewhere classified: Secondary | ICD-10-CM | POA: Diagnosis not present

## 2015-02-14 DIAGNOSIS — M25562 Pain in left knee: Secondary | ICD-10-CM | POA: Diagnosis not present

## 2015-02-14 DIAGNOSIS — M6281 Muscle weakness (generalized): Secondary | ICD-10-CM | POA: Diagnosis not present

## 2015-02-19 DIAGNOSIS — M25662 Stiffness of left knee, not elsewhere classified: Secondary | ICD-10-CM | POA: Diagnosis not present

## 2015-02-19 DIAGNOSIS — M25562 Pain in left knee: Secondary | ICD-10-CM | POA: Diagnosis not present

## 2015-02-19 DIAGNOSIS — M6281 Muscle weakness (generalized): Secondary | ICD-10-CM | POA: Diagnosis not present

## 2015-02-21 DIAGNOSIS — M25562 Pain in left knee: Secondary | ICD-10-CM | POA: Diagnosis not present

## 2015-02-21 DIAGNOSIS — M25662 Stiffness of left knee, not elsewhere classified: Secondary | ICD-10-CM | POA: Diagnosis not present

## 2015-02-21 DIAGNOSIS — M6281 Muscle weakness (generalized): Secondary | ICD-10-CM | POA: Diagnosis not present

## 2015-02-26 DIAGNOSIS — M25662 Stiffness of left knee, not elsewhere classified: Secondary | ICD-10-CM | POA: Diagnosis not present

## 2015-02-26 DIAGNOSIS — M25562 Pain in left knee: Secondary | ICD-10-CM | POA: Diagnosis not present

## 2015-02-26 DIAGNOSIS — M6281 Muscle weakness (generalized): Secondary | ICD-10-CM | POA: Diagnosis not present

## 2015-02-28 DIAGNOSIS — M6281 Muscle weakness (generalized): Secondary | ICD-10-CM | POA: Diagnosis not present

## 2015-02-28 DIAGNOSIS — M25562 Pain in left knee: Secondary | ICD-10-CM | POA: Diagnosis not present

## 2015-02-28 DIAGNOSIS — M25662 Stiffness of left knee, not elsewhere classified: Secondary | ICD-10-CM | POA: Diagnosis not present

## 2015-03-05 DIAGNOSIS — M25562 Pain in left knee: Secondary | ICD-10-CM | POA: Diagnosis not present

## 2015-03-06 DIAGNOSIS — M6281 Muscle weakness (generalized): Secondary | ICD-10-CM | POA: Diagnosis not present

## 2015-03-06 DIAGNOSIS — M25662 Stiffness of left knee, not elsewhere classified: Secondary | ICD-10-CM | POA: Diagnosis not present

## 2015-03-06 DIAGNOSIS — M25562 Pain in left knee: Secondary | ICD-10-CM | POA: Diagnosis not present

## 2015-03-08 DIAGNOSIS — M25662 Stiffness of left knee, not elsewhere classified: Secondary | ICD-10-CM | POA: Diagnosis not present

## 2015-03-08 DIAGNOSIS — M6281 Muscle weakness (generalized): Secondary | ICD-10-CM | POA: Diagnosis not present

## 2015-03-08 DIAGNOSIS — M25562 Pain in left knee: Secondary | ICD-10-CM | POA: Diagnosis not present

## 2015-03-12 DIAGNOSIS — M25662 Stiffness of left knee, not elsewhere classified: Secondary | ICD-10-CM | POA: Diagnosis not present

## 2015-03-12 DIAGNOSIS — M6281 Muscle weakness (generalized): Secondary | ICD-10-CM | POA: Diagnosis not present

## 2015-03-12 DIAGNOSIS — M25562 Pain in left knee: Secondary | ICD-10-CM | POA: Diagnosis not present

## 2015-03-14 DIAGNOSIS — M6281 Muscle weakness (generalized): Secondary | ICD-10-CM | POA: Diagnosis not present

## 2015-03-14 DIAGNOSIS — M25662 Stiffness of left knee, not elsewhere classified: Secondary | ICD-10-CM | POA: Diagnosis not present

## 2015-03-14 DIAGNOSIS — M25562 Pain in left knee: Secondary | ICD-10-CM | POA: Diagnosis not present

## 2015-03-19 DIAGNOSIS — M25662 Stiffness of left knee, not elsewhere classified: Secondary | ICD-10-CM | POA: Diagnosis not present

## 2015-03-19 DIAGNOSIS — M25562 Pain in left knee: Secondary | ICD-10-CM | POA: Diagnosis not present

## 2015-03-19 DIAGNOSIS — M6281 Muscle weakness (generalized): Secondary | ICD-10-CM | POA: Diagnosis not present

## 2015-03-21 DIAGNOSIS — M25562 Pain in left knee: Secondary | ICD-10-CM | POA: Diagnosis not present

## 2015-03-21 DIAGNOSIS — M25662 Stiffness of left knee, not elsewhere classified: Secondary | ICD-10-CM | POA: Diagnosis not present

## 2015-03-21 DIAGNOSIS — M6281 Muscle weakness (generalized): Secondary | ICD-10-CM | POA: Diagnosis not present

## 2015-04-02 DIAGNOSIS — M25562 Pain in left knee: Secondary | ICD-10-CM | POA: Diagnosis not present

## 2015-08-07 ENCOUNTER — Other Ambulatory Visit: Payer: Self-pay

## 2015-08-07 MED ORDER — METOPROLOL TARTRATE 50 MG PO TABS: 50.0000 mg | ORAL_TABLET | Freq: Two times a day (BID) | ORAL | 0 refills | Status: DC

## 2015-08-07 NOTE — Telephone Encounter (Signed)
Rx sent electronically.  

## 2015-08-11 ENCOUNTER — Other Ambulatory Visit: Payer: Self-pay | Admitting: Internal Medicine

## 2015-08-31 ENCOUNTER — Other Ambulatory Visit: Payer: Self-pay

## 2015-08-31 MED ORDER — METOPROLOL TARTRATE 50 MG PO TABS: 50.0000 mg | ORAL_TABLET | Freq: Two times a day (BID) | ORAL | 1 refills | Status: DC

## 2015-08-31 MED ORDER — LOSARTAN POTASSIUM 50 MG PO TABS: 50.0000 mg | ORAL_TABLET | Freq: Every day | ORAL | 1 refills | Status: DC

## 2015-08-31 NOTE — Telephone Encounter (Signed)
Rxs sent electronically.  

## 2015-09-04 DIAGNOSIS — Z1231 Encounter for screening mammogram for malignant neoplasm of breast: Secondary | ICD-10-CM | POA: Diagnosis not present

## 2015-09-07 ENCOUNTER — Encounter: Payer: Self-pay | Admitting: Internal Medicine

## 2015-12-13 ENCOUNTER — Ambulatory Visit (INDEPENDENT_AMBULATORY_CARE_PROVIDER_SITE_OTHER): Payer: Medicare Other | Admitting: Internal Medicine

## 2015-12-13 ENCOUNTER — Encounter: Payer: Self-pay | Admitting: Internal Medicine

## 2015-12-13 VITALS — BP 122/86 | HR 47 | Temp 97.8°F | Ht 63.0 in | Wt 184.0 lb

## 2015-12-13 DIAGNOSIS — I10 Essential (primary) hypertension: Secondary | ICD-10-CM | POA: Diagnosis not present

## 2015-12-13 DIAGNOSIS — Z23 Encounter for immunization: Secondary | ICD-10-CM | POA: Diagnosis not present

## 2015-12-13 DIAGNOSIS — E785 Hyperlipidemia, unspecified: Secondary | ICD-10-CM

## 2015-12-13 DIAGNOSIS — Z7189 Other specified counseling: Secondary | ICD-10-CM

## 2015-12-13 DIAGNOSIS — M159 Polyosteoarthritis, unspecified: Secondary | ICD-10-CM

## 2015-12-13 DIAGNOSIS — M150 Primary generalized (osteo)arthritis: Secondary | ICD-10-CM

## 2015-12-13 DIAGNOSIS — Z0000 Encounter for general adult medical examination without abnormal findings: Secondary | ICD-10-CM

## 2015-12-13 NOTE — Progress Notes (Signed)
Pre visit review using our clinic review tool, if applicable. No additional management support is needed unless otherwise documented below in the visit note. 

## 2015-12-13 NOTE — Patient Instructions (Signed)
DASH Eating Plan DASH stands for "Dietary Approaches to Stop Hypertension." The DASH eating plan is a healthy eating plan that has been shown to reduce high blood pressure (hypertension). Additional health benefits may include reducing the risk of type 2 diabetes mellitus, heart disease, and stroke. The DASH eating plan may also help with weight loss. What do I need to know about the DASH eating plan? For the DASH eating plan, you will follow these general guidelines:  Choose foods with less than 150 milligrams of sodium per serving (as listed on the food label).  Use salt-free seasonings or herbs instead of table salt or sea salt.  Check with your health care provider or pharmacist before using salt substitutes.  Eat lower-sodium products. These are often labeled as "low-sodium" or "no salt added."  Eat fresh foods. Avoid eating a lot of canned foods.  Eat more vegetables, fruits, and low-fat dairy products.  Choose whole grains. Look for the word "whole" as the first word in the ingredient list.  Choose fish and skinless chicken or turkey more often than red meat. Limit fish, poultry, and meat to 6 oz (170 g) each day.  Limit sweets, desserts, sugars, and sugary drinks.  Choose heart-healthy fats.  Eat more home-cooked food and less restaurant, buffet, and fast food.  Limit fried foods.  Do not fry foods. Cook foods using methods such as baking, boiling, grilling, and broiling instead.  When eating at a restaurant, ask that your food be prepared with less salt, or no salt if possible. What foods can I eat? Seek help from a dietitian for individual calorie needs. Grains  Whole grain or whole wheat bread. Brown rice. Whole grain or whole wheat pasta. Quinoa, bulgur, and whole grain cereals. Low-sodium cereals. Corn or whole wheat flour tortillas. Whole grain cornbread. Whole grain crackers. Low-sodium crackers. Vegetables  Fresh or frozen vegetables (raw, steamed, roasted, or  grilled). Low-sodium or reduced-sodium tomato and vegetable juices. Low-sodium or reduced-sodium tomato sauce and paste. Low-sodium or reduced-sodium canned vegetables. Fruits  All fresh, canned (in natural juice), or frozen fruits. Meat and Other Protein Products  Ground beef (85% or leaner), grass-fed beef, or beef trimmed of fat. Skinless chicken or turkey. Ground chicken or turkey. Pork trimmed of fat. All fish and seafood. Eggs. Dried beans, peas, or lentils. Unsalted nuts and seeds. Unsalted canned beans. Dairy  Low-fat dairy products, such as skim or 1% milk, 2% or reduced-fat cheeses, low-fat ricotta or cottage cheese, or plain low-fat yogurt. Low-sodium or reduced-sodium cheeses. Fats and Oils  Tub margarines without trans fats. Light or reduced-fat mayonnaise and salad dressings (reduced sodium). Avocado. Safflower, olive, or canola oils. Natural peanut or almond butter. Other  Unsalted popcorn and pretzels. The items listed above may not be a complete list of recommended foods or beverages. Contact your dietitian for more options.  What foods are not recommended? Grains  White bread. White pasta. White rice. Refined cornbread. Bagels and croissants. Crackers that contain trans fat. Vegetables  Creamed or fried vegetables. Vegetables in a cheese sauce. Regular canned vegetables. Regular canned tomato sauce and paste. Regular tomato and vegetable juices. Fruits  Canned fruit in light or heavy syrup. Fruit juice. Meat and Other Protein Products  Fatty cuts of meat. Ribs, chicken wings, bacon, sausage, bologna, salami, chitterlings, fatback, hot dogs, bratwurst, and packaged luncheon meats. Salted nuts and seeds. Canned beans with salt. Dairy  Whole or 2% milk, cream, half-and-half, and cream cheese. Whole-fat or sweetened yogurt. Full-fat cheeses   or blue cheese. Nondairy creamers and whipped toppings. Processed cheese, cheese spreads, or cheese curds. Condiments  Onion and garlic  salt, seasoned salt, table salt, and sea salt. Canned and packaged gravies. Worcestershire sauce. Tartar sauce. Barbecue sauce. Teriyaki sauce. Soy sauce, including reduced sodium. Steak sauce. Fish sauce. Oyster sauce. Cocktail sauce. Horseradish. Ketchup and mustard. Meat flavorings and tenderizers. Bouillon cubes. Hot sauce. Tabasco sauce. Marinades. Taco seasonings. Relishes. Fats and Oils  Butter, stick margarine, lard, shortening, ghee, and bacon fat. Coconut, palm kernel, or palm oils. Regular salad dressings. Other  Pickles and olives. Salted popcorn and pretzels. The items listed above may not be a complete list of foods and beverages to avoid. Contact your dietitian for more information.  Where can I find more information? National Heart, Lung, and Blood Institute: www.nhlbi.nih.gov/health/health-topics/topics/dash/ This information is not intended to replace advice given to you by your health care provider. Make sure you discuss any questions you have with your health care provider. Document Released: 12/12/2010 Document Revised: 05/31/2015 Document Reviewed: 10/27/2012 Elsevier Interactive Patient Education  2017 Elsevier Inc.  

## 2015-12-13 NOTE — Assessment & Plan Note (Signed)
BP Readings from Last 3 Encounters:  12/13/15 122/86  12/07/14 120/80  11/01/14 (!) 149/85   Good control now

## 2015-12-13 NOTE — Assessment & Plan Note (Signed)
I have personally reviewed the Medicare Annual Wellness questionnaire and have noted 1. The patient's medical and social history 2. Their use of alcohol, tobacco or illicit drugs 3. Their current medications and supplements 4. The patient's functional ability including ADL's, fall risks, home safety risks and hearing or visual             impairment. 5. Diet and physical activities 6. Evidence for depression or mood disorders  The patients weight, height, BMI and visual acuity have been recorded in the chart I have made referrals, counseling and provided education to the patient based review of the above and I have provided the pt with a written personalized care plan for preventive services.  I have provided you with a copy of your personalized plan for preventive services. Please take the time to review along with your updated medication list.  No zostavax due to cost Discussed mammograms every 2 years till 3980 Done with colons (unless symptoms) Flu vaccine today

## 2015-12-13 NOTE — Assessment & Plan Note (Signed)
No problems with statin for primary prevention 

## 2015-12-13 NOTE — Progress Notes (Signed)
Subjective:    Patient ID: Kathryn Lopez, female    DOB: 28-Oct-1940, 75 y.o.   MRN: [id number]  HPI Here for Medicare wellness visit and follow up of chronic medical conditions Reviewed form and advanced directives Reviewed other doctors No alcohol or tobacco Tries to walk regularly Vision and hearing are okay No falls No depression or anhedonia Independent with instrumental ADLs No sig issues with memory  Did have left knee meniscus repair She is better but not overwhelmed with the response  Was having some sinus symptoms Started last week after doing leaves--better now Alka seltzer plus helped---discussed just using antihistamines  No chest pain or SOB No palpitations  No dizziness or syncope No edema  No myalgias on statin No GI symptoms  Current Outpatient Prescriptions on File Prior to Visit  Medication Sig Dispense Refill  . acetaminophen (TYLENOL) 325 MG tablet Take 650 mg by mouth 3 (three) times daily as needed for mild pain.    . cholecalciferol (VITAMIN D) 1000 UNITS tablet Take 1,000 Units by mouth daily.    Marland Kitchen. losartan (COZAAR) 50 MG tablet Take 1 tablet (50 mg total) by mouth daily. 90 tablet 1  . metoprolol (LOPRESSOR) 50 MG tablet Take 1 tablet (50 mg total) by mouth 2 (two) times daily. 180 tablet 1  . simvastatin (ZOCOR) 20 MG tablet TAKE 1 TABLET (20 MG TOTAL) BY MOUTH DAILY. 90 tablet 3   No current facility-administered medications on file prior to visit.     No Known Allergies  Past Medical History:  Diagnosis Date  . Hyperlipidemia   . Hypertension   . Osteoarthritis, multiple sites     Past Surgical History:  Procedure Laterality Date  . APPENDECTOMY    . CATARACT EXTRACTION W/ INTRAOCULAR LENS  IMPLANT, BILATERAL Bilateral 2012  . KNEE ARTHROSCOPY Left 2017  . TONSILLECTOMY      Family History  Problem Relation Age of Onset  . Stroke Father   . Hypertension Sister   . Heart disease Sister     heart valve replaced  .  Diabetes Brother   . Hypertension Brother   . Diabetes Brother   . Hypertension Brother   . Hypertension Sister   . Hypertension Sister   . Hypertension Sister   . Cancer Maternal Grandmother     breast cancer    Social History   Social History  . Marital status: Divorced    Spouse name: N/A  . Number of children: 1  . Years of education: N/A   Occupational History  . 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  Retired   Social History Main Topics  . Smoking status: Never Smoker  . Smokeless tobacco: Never Used  . Alcohol use No  . Drug use: No  . Sexual activity: Not on file   Other Topics Concern  . Not on file   Social History Narrative   Widowed twice then divorced   One son      No living will   Would have son be health care POA if needed   Not sure about DNR-- very adamant about not being kept on life support   No feeding tube!!   Review of Systems Appetite is good Trying to be careful---has lost 5# Sleeps fairly well Wears seat belt Full dentures--doesn't see dentist Bowels are regular --no blood in stool Did have mammogram this year Voids okay--nocturia x 1-2. Some urgency--but no incontinence as yet No sig back or  joint pain--gets achy in knees/back if she has prolonged standing No suspicious skin lesions     Objective:   Physical Exam  Constitutional: She is oriented to person, place, and time. She appears well-developed and well-nourished. No distress.  HENT:  Mouth/Throat: Oropharynx is clear and moist. No oropharyngeal exudate.  Neck: Normal range of motion. Neck supple. No thyromegaly present.  Cardiovascular: Normal rate, regular rhythm, normal heart sounds and intact distal pulses.  Exam reveals no gallop.   No murmur heard. Pulmonary/Chest: Effort normal and breath sounds normal. No respiratory distress. She has no wheezes. She has no rales.  Abdominal: Soft. There is no tenderness.  Musculoskeletal: She exhibits no edema or tenderness.    Lymphadenopathy:    She has no cervical adenopathy.  Neurological: She is alert and oriented to person, place, and time.  President-- "Alinda Sierrasrump, Obama, Bush" [phone] D-l-o-r-w Recall 3/3  Skin: No rash noted. No erythema.  Psychiatric: She has a normal mood and affect. Her behavior is normal.          Assessment & Plan:

## 2015-12-13 NOTE — Assessment & Plan Note (Signed)
Doing fairly well Tylenol prn only

## 2015-12-13 NOTE — Assessment & Plan Note (Signed)
Reluctant to formalize this See social history though

## 2015-12-14 LAB — LIPID PANEL
CHOL/HDL RATIO: 4
Cholesterol: 176 mg/dL (ref 0–200)
HDL: 49.8 mg/dL (ref >39.00)
LDL Cholesterol: 106 mg/dL — ABNORMAL HIGH (ref 0–99)
NONHDL: 126.3
Triglycerides: 104 mg/dL (ref 0.0–149.0)
VLDL: 20.8 mg/dL (ref 0.0–40.0)

## 2015-12-14 LAB — CBC WITH DIFFERENTIAL/PLATELET
BASOS ABS: 0 10*3/uL (ref 0.0–0.1)
BASOS PCT: 0.4 % (ref 0.0–3.0)
Eosinophils Absolute: 0 10*3/uL (ref 0.0–0.7)
Eosinophils Relative: 1.2 % (ref 0.0–5.0)
HEMATOCRIT: 43.9 % (ref 36.0–46.0)
Hemoglobin: 14.8 g/dL (ref 12.0–15.0)
LYMPHS ABS: 1.6 10*3/uL (ref 0.7–4.0)
LYMPHS PCT: 39.9 % (ref 12.0–46.0)
MCHC: 33.8 g/dL (ref 30.0–36.0)
MCV: 89.6 fl (ref 78.0–100.0)
MONOS PCT: 14 % — AB (ref 3.0–12.0)
Monocytes Absolute: 0.6 10*3/uL (ref 0.1–1.0)
NEUTROS ABS: 1.8 10*3/uL (ref 1.4–7.7)
NEUTROS PCT: 44.5 % (ref 43.0–77.0)
PLATELETS: 175 10*3/uL (ref 150.0–400.0)
RBC: 4.9 Mil/uL (ref 3.87–5.11)
RDW: 13.1 % (ref 11.5–15.5)
WBC: 4 10*3/uL (ref 4.0–10.5)

## 2015-12-14 LAB — COMPREHENSIVE METABOLIC PANEL
ALT: 27 U/L (ref 0–35)
AST: 28 U/L (ref 0–37)
Albumin: 4.4 g/dL (ref 3.5–5.2)
Alkaline Phosphatase: 84 U/L (ref 39–117)
BILIRUBIN TOTAL: 0.4 mg/dL (ref 0.2–1.2)
BUN: 13 mg/dL (ref 6–23)
CHLORIDE: 99 meq/L (ref 96–112)
CO2: 29 meq/L (ref 19–32)
CREATININE: 1.18 mg/dL (ref 0.40–1.20)
Calcium: 9.5 mg/dL (ref 8.4–10.5)
GFR: 47.19 mL/min — ABNORMAL LOW (ref >60.00)
GLUCOSE: 89 mg/dL (ref 70–99)
Potassium: 4.5 mEq/L (ref 3.5–5.1)
Sodium: 136 mEq/L (ref 135–145)
Total Protein: 7.8 g/dL (ref 6.0–8.3)

## 2016-04-28 ENCOUNTER — Other Ambulatory Visit: Payer: Self-pay

## 2016-04-28 MED ORDER — SIMVASTATIN 20 MG PO TABS: ORAL_TABLET | ORAL | 2 refills | Status: DC

## 2016-04-28 MED ORDER — METOPROLOL TARTRATE 50 MG PO TABS: 50.0000 mg | ORAL_TABLET | Freq: Two times a day (BID) | ORAL | 2 refills | Status: DC

## 2016-04-28 MED ORDER — LOSARTAN POTASSIUM 50 MG PO TABS: 50.0000 mg | ORAL_TABLET | Freq: Every day | ORAL | 2 refills | Status: DC

## 2016-04-28 NOTE — Telephone Encounter (Signed)
Pt has changed ins co and request losartan,meloprolol and simvastatin [facility]ills [facility]. Last annual 12/13/15. Refills done per protocol and pt voiced understanding.

## 2016-05-05 ENCOUNTER — Telehealth: Payer: Self-pay

## 2016-05-05 ENCOUNTER — Other Ambulatory Visit: Payer: Self-pay | Admitting: Internal Medicine

## 2016-05-05 MED ORDER — METOPROLOL TARTRATE 50 MG PO TABS: 50.0000 mg | ORAL_TABLET | Freq: Two times a day (BID) | ORAL | 0 refills | Status: DC

## 2016-05-05 NOTE — Telephone Encounter (Signed)
Pt has not got metoprolol from mail order yet. Pt request refill metoprolol # 20 from [facility] [address]. Advised pt done.

## 2016-12-15 ENCOUNTER — Encounter: Payer: Self-pay | Admitting: Internal Medicine

## 2016-12-20 ENCOUNTER — Other Ambulatory Visit: Payer: Self-pay | Admitting: Internal Medicine

## 2017-03-02 ENCOUNTER — Ambulatory Visit (INDEPENDENT_AMBULATORY_CARE_PROVIDER_SITE_OTHER): Payer: Medicare Other | Admitting: Internal Medicine

## 2017-03-02 ENCOUNTER — Encounter: Payer: Self-pay | Admitting: Internal Medicine

## 2017-03-02 VITALS — BP 140/80 | HR 52 | Temp 98.6°F | Ht 63.25 in | Wt 179.0 lb

## 2017-03-02 DIAGNOSIS — Z23 Encounter for immunization: Secondary | ICD-10-CM | POA: Diagnosis not present

## 2017-03-02 DIAGNOSIS — M159 Polyosteoarthritis, unspecified: Secondary | ICD-10-CM

## 2017-03-02 DIAGNOSIS — Z0000 Encounter for general adult medical examination without abnormal findings: Secondary | ICD-10-CM | POA: Diagnosis not present

## 2017-03-02 DIAGNOSIS — Z7189 Other specified counseling: Secondary | ICD-10-CM

## 2017-03-02 DIAGNOSIS — E785 Hyperlipidemia, unspecified: Secondary | ICD-10-CM | POA: Diagnosis not present

## 2017-03-02 DIAGNOSIS — M150 Primary generalized (osteo)arthritis: Secondary | ICD-10-CM

## 2017-03-02 DIAGNOSIS — I10 Essential (primary) hypertension: Secondary | ICD-10-CM

## 2017-03-02 NOTE — Patient Instructions (Signed)
You are due for your mammogram in September Get your next flu vaccine in October

## 2017-03-02 NOTE — Assessment & Plan Note (Signed)
Continues primary prevention with statin

## 2017-03-02 NOTE — Assessment & Plan Note (Signed)
Mostly left knee No meds needed at this point

## 2017-03-02 NOTE — Progress Notes (Signed)
Subjective:    Patient ID: Kathryn Lopez, female    DOB: October 25, 1940, 77 y.o.   MRN: [id number]  HPI Here for Medicare wellness visit and follow up of chronic health conditions Reviewed form and advanced directives Reviewed other doctors No alcohol or tobacco Tries to walk, and does her yard, etc No falls Vision and hearing are fine Doesn't have a car---this limits her. No depression or anhedonia No memory problems Independent with instrumental ADLs  [facility], water backed up, etc Otherwise doing well No concerns about her health  No chest pain No SOB No dizziness or syncope No edema  Still on statin No GI problems or myalgias  Has arthritis pain in left knee No other joint pain Hasn't used any meds--not that bad  Current Outpatient Medications on File Prior to Visit  Medication Sig Dispense Refill  . acetaminophen (TYLENOL) 325 MG tablet Take 650 mg by mouth 3 (three) times daily as needed for mild pain.    . cholecalciferol (VITAMIN D) 1000 UNITS tablet Take 1,000 Units by mouth daily.    Marland Kitchen. losartan (COZAAR) 50 MG tablet Take 1 tablet by mouth daily 90 tablet 0  . metoprolol (LOPRESSOR) 50 MG tablet Take 1 tablet (50 mg total) by mouth 2 (two) times daily. 20 tablet 0  . simvastatin (ZOCOR) 20 MG tablet Take 1 tablet by mouth daily 90 tablet 0   No current facility-administered medications on file prior to visit.     No Known Allergies  Past Medical History:  Diagnosis Date  . Hyperlipidemia   . Hypertension   . Osteoarthritis, multiple sites     Past Surgical History:  Procedure Laterality Date  . APPENDECTOMY    . CATARACT EXTRACTION W/ INTRAOCULAR LENS  IMPLANT, BILATERAL Bilateral 2012  . KNEE ARTHROSCOPY Left 2017  . TONSILLECTOMY      Family History  Problem Relation Age of Onset  . Stroke Father   . Hypertension Sister   . Heart disease Sister        heart valve replaced  . Diabetes Brother   .  Hypertension Brother   . Diabetes Brother   . Hypertension Brother   . Hypertension Sister   . Hypertension Sister   . Hypertension Sister   . Cancer Maternal Grandmother        breast cancer    Social History   Socioeconomic History  . Marital status: Divorced    Spouse name: Not on file  . Number of children: 1  . Years of education: Not on file  . Highest education level: Not on file  Social Needs  . Financial resource strain: Not on file  . Food insecurity - worry: Not on file  . Food insecurity - inability: Not on file  . Transportation needs - medical: Not on file  . Transportation needs - non-medical: Not on file  Occupational History  . Occupation: Engineering geologistetail    Comment: Retired from [employer]    Employer: RETIRED  Tobacco Use  . Smoking status: Never Smoker  . Smokeless tobacco: Never Used  Substance and Sexual Activity  . Alcohol use: No    Alcohol/week: 0.0 oz  . Drug use: No  . Sexual activity: Not on file  Other Topics Concern  . Not on file  Social History Narrative   Widowed twice then divorced   One son      No living will   Would have son be health care  POA if needed   Not sure about DNR-- very adamant about not being kept on life support   No feeding tube!!   Review of Systems No headaches Sleeps fairly good---up 1-2 times to urinate. No daytime urinary symptoms Appetite is good Weight down slightly Wears seat belt Bowels are somewhat different--- may go 2-3 days without going. No abdominal pain or blood in stool Wears seat belt Teeth okay--- full dentures No heartburn or dysphagia No skin problems    Objective:   Physical Exam  Constitutional: She is oriented to person, place, and time. She appears well-developed. No distress.  HENT:  Mouth/Throat: Oropharynx is clear and moist. No oropharyngeal exudate.  Neck: No thyromegaly present.  Cardiovascular: Regular rhythm, normal heart sounds and intact distal pulses. Exam reveals no gallop.    No murmur heard. Rate ~56  Pulmonary/Chest: Effort normal and breath sounds normal. No respiratory distress. She has no wheezes. She has no rales.  Abdominal: Soft. There is no tenderness.  Musculoskeletal: She exhibits no edema or tenderness.  Lymphadenopathy:    She has no cervical adenopathy.  Neurological: She is alert and oriented to person, place, and time.  President--"Trump, Obama, ?" [id number] D-l-r-o-w Recall 3/3  Skin: No rash noted. No erythema.  Psychiatric: She has a normal mood and affect. Her behavior is normal.          Assessment & Plan:

## 2017-03-02 NOTE — Assessment & Plan Note (Signed)
BP Readings from Last 3 Encounters:  03/02/17 140/80  12/13/15 122/86  12/07/14 120/80   Good control No changes Check labs

## 2017-03-02 NOTE — Assessment & Plan Note (Signed)
I have personally reviewed the Medicare Annual Wellness questionnaire and have noted 1. The patient's medical and social history 2. Their use of alcohol, tobacco or illicit drugs 3. Their current medications and supplements 4. The patient's functional ability including ADL's, fall risks, home safety risks and hearing or visual             impairment. 5. Diet and physical activities 6. Evidence for depression or mood disorders  The patients weight, height, BMI and visual acuity have been recorded in the chart I have made referrals, counseling and provided education to the patient based review of the above and I have provided the pt with a written personalized care plan for preventive services.  I have provided you with a copy of your personalized plan for preventive services. Please take the time to review along with your updated medication list.  Flu vaccine today--again in October Mammogram due 9/19 No more colons unless symptoms Discussed exercise

## 2017-03-02 NOTE — Addendum Note (Signed)
Addended by: Damita LackLORING, Mae Cianci S on: 03/02/2017 03:49 PM   Modules accepted: Orders

## 2017-03-02 NOTE — Assessment & Plan Note (Signed)
Not DNR but adamant about no extended life support

## 2017-03-03 LAB — CBC
HEMATOCRIT: 41.8 % (ref 36.0–46.0)
Hemoglobin: 14 g/dL (ref 12.0–15.0)
MCHC: 33.6 g/dL (ref 30.0–36.0)
MCV: 90.7 fl (ref 78.0–100.0)
Platelets: 168 10*3/uL (ref 150.0–400.0)
RBC: 4.61 Mil/uL (ref 3.87–5.11)
RDW: 13.6 % (ref 11.5–15.5)
WBC: 4.6 10*3/uL (ref 4.0–10.5)

## 2017-03-03 LAB — COMPREHENSIVE METABOLIC PANEL
ALT: 23 U/L (ref 0–35)
AST: 25 U/L (ref 0–37)
Albumin: 4.2 g/dL (ref 3.5–5.2)
Alkaline Phosphatase: 65 U/L (ref 39–117)
BUN: 16 mg/dL (ref 6–23)
CALCIUM: 9.8 mg/dL (ref 8.4–10.5)
CHLORIDE: 103 meq/L (ref 96–112)
CO2: 28 meq/L (ref 19–32)
CREATININE: 1.12 mg/dL (ref 0.40–1.20)
GFR: 49.96 mL/min — ABNORMAL LOW (ref >60.00)
GLUCOSE: 92 mg/dL (ref 70–99)
Potassium: 4.8 mEq/L (ref 3.5–5.1)
SODIUM: 140 meq/L (ref 135–145)
Total Bilirubin: 0.6 mg/dL (ref 0.2–1.2)
Total Protein: 7.4 g/dL (ref 6.0–8.3)

## 2017-03-03 LAB — LIPID PANEL
CHOL/HDL RATIO: 3
Cholesterol: 149 mg/dL (ref 0–200)
HDL: 52.5 mg/dL (ref >39.00)
LDL CALC: 81 mg/dL (ref 0–99)
NONHDL: 96.93
Triglycerides: 78 mg/dL (ref 0.0–149.0)
VLDL: 15.6 mg/dL (ref 0.0–40.0)

## 2017-05-05 ENCOUNTER — Other Ambulatory Visit: Payer: Self-pay | Admitting: Internal Medicine

## 2017-05-05 MED ORDER — SIMVASTATIN 20 MG PO TABS: ORAL_TABLET | ORAL | 1 refills | Status: DC

## 2017-05-05 MED ORDER — METOPROLOL TARTRATE 50 MG PO TABS: 50.0000 mg | ORAL_TABLET | Freq: Two times a day (BID) | ORAL | 1 refills | Status: DC

## 2017-05-05 MED ORDER — LOSARTAN POTASSIUM 50 MG PO TABS: 50.0000 mg | ORAL_TABLET | Freq: Every day | ORAL | 1 refills | Status: DC

## 2017-05-05 NOTE — Telephone Encounter (Signed)
Copied from CRM [id number]. Topic: Quick Communication - Rx Refill/Question >> May 05, 2017  2:14 PM Laural Benes, [place] C wrote: Medication: simvastatin (ZOCOR) 20 MG tablet, metoprolol (LOPRESSOR) 50 MG tablet, losartan (COZAAR) 50 MG tablet    Has the patient contacted their pharmacy? Yes   (Agent: If no, request that the patient contact the pharmacy for the refill.)  Preferred Pharmacy (with phone number or street name): [facility] - [place], [place] - [address] Agent: Please be advised that RX refills may take up to 3 business days. We ask that you follow-up with your pharmacy.

## 2017-05-07 ENCOUNTER — Telehealth: Payer: Self-pay | Admitting: Internal Medicine

## 2017-05-07 NOTE — Telephone Encounter (Signed)
Copied from CRM [id number]. Topic: Quick Communication - Rx Refill/Question >> May 07, 2017 11:27 AM Raquel Sarna wrote: metoprolol tartrate (LOPRESSOR) 50 MG tablet  Pharmacy needing to know the directions for Rx prescribed.  Not clear to fill.  Call [facility] - [phone]

## 2017-05-07 NOTE — Telephone Encounter (Signed)
Left message on  Kathryn Lopez's voicemail with pt's current prescription instructions which state for the pt to take 1 tablet (50 mg) by mouth 2 times daily.

## 2017-05-08 MED ORDER — METOPROLOL TARTRATE 50 MG PO TABS: 50.0000 mg | ORAL_TABLET | Freq: Two times a day (BID) | ORAL | 1 refills | Status: DC

## 2017-05-08 NOTE — Telephone Encounter (Signed)
Hope from [place] got the message of how pt takes the lopressor but since takes bid needs 90 day supply; changed qty to # 180 x 1 refill. Updated med list also.

## 2017-05-08 NOTE — Addendum Note (Signed)
Addended by: Patience Musca on: 05/08/2017 08:36 AM   Modules accepted: Orders

## 2017-08-13 ENCOUNTER — Telehealth: Payer: Self-pay | Admitting: Internal Medicine

## 2017-08-13 MED ORDER — LOSARTAN POTASSIUM 50 MG PO TABS: 50.0000 mg | ORAL_TABLET | Freq: Every day | ORAL | 1 refills | Status: DC

## 2017-08-13 MED ORDER — METOPROLOL TARTRATE 50 MG PO TABS: 50.0000 mg | ORAL_TABLET | Freq: Two times a day (BID) | ORAL | 1 refills | Status: DC

## 2017-08-13 MED ORDER — SIMVASTATIN 20 MG PO TABS: ORAL_TABLET | ORAL | 1 refills | Status: DC

## 2017-08-13 NOTE — Telephone Encounter (Signed)
Copied from CRM [id number]. Topic: Inquiry >> Aug 13, 2017  1:13 PM Yvonna Alanisobinson, Andra M wrote: Reason for CRM: Patient called requesting refills for the following three medications: Losartan (COZAAR) 50 MG tablet, Metoprolol tartrate (LOPRESSOR) 50 MG tablet, and Simvastatin (ZOCOR) 20 MG tablet. Patient has contacted the pharmacy. Patient's preferred pharmacy [facility] [place] [place] [address] [phone] (Phone)  [phone] (Fax).       Thank You!!!

## 2017-09-26 DIAGNOSIS — Z1231 Encounter for screening mammogram for malignant neoplasm of breast: Secondary | ICD-10-CM | POA: Diagnosis not present

## 2018-02-27 ENCOUNTER — Encounter: Payer: Self-pay | Admitting: Internal Medicine

## 2018-02-27 ENCOUNTER — Ambulatory Visit (INDEPENDENT_AMBULATORY_CARE_PROVIDER_SITE_OTHER): Payer: Medicare Other | Admitting: Internal Medicine

## 2018-02-27 VITALS — BP 118/74 | HR 68 | Temp 97.9°F | Ht 63.25 in | Wt 190.0 lb

## 2018-02-27 DIAGNOSIS — B029 Zoster without complications: Secondary | ICD-10-CM

## 2018-02-27 MED ORDER — VALACYCLOVIR HCL 1 G PO TABS: 1000.0000 mg | ORAL_TABLET | Freq: Three times a day (TID) | ORAL | 0 refills | Status: DC

## 2018-02-27 NOTE — Progress Notes (Signed)
Subjective:    Patient ID: Kathryn Lopez, female    DOB: 1940-03-04, 78 y.o.   MRN: [id number]  HPI  Pt presents to the clinic today with c/o rash to her right arm. She reports she starting having right arm pain on Wednesday but did not notice the rash until last night. She reports the rash hurts and burns. The rash hs not seem to spread. She has taken Tylenol with minimal relief. No one in her home has a similar rash. She denies changes in soaps, lotions or detergents.   Review of Systems      Past Medical History:  Diagnosis Date  . Hyperlipidemia   . Hypertension   . Osteoarthritis, multiple sites     Current Outpatient Medications  Medication Sig Dispense Refill  . acetaminophen (TYLENOL) 325 MG tablet Take 650 mg by mouth 3 (three) times daily as needed for mild pain.    . cholecalciferol (VITAMIN D) 1000 UNITS tablet Take 1,000 Units by mouth daily.    Marland Kitchen losartan (COZAAR) 50 MG tablet Take 1 tablet (50 mg total) by mouth daily. 90 tablet 1  . metoprolol tartrate (LOPRESSOR) 50 MG tablet Take 1 tablet (50 mg total) by mouth 2 (two) times daily. 180 tablet 1  . simvastatin (ZOCOR) 20 MG tablet Take 1 tablet by mouth daily 90 tablet 1  . valACYclovir (VALTREX) 1000 MG tablet Take 1 tablet (1,000 mg total) by mouth 3 (three) times daily. 21 tablet 0   No current facility-administered medications for this visit.     No Known Allergies  Family History  Problem Relation Age of Onset  . Stroke Father   . Hypertension Sister   . Heart disease Sister        heart valve replaced  . Diabetes Brother   . Hypertension Brother   . Diabetes Brother   . Hypertension Brother   . Hypertension Sister   . Hypertension Sister   . Hypertension Sister   . Cancer Maternal Grandmother        breast cancer    Social History   Socioeconomic History  . Marital status: Divorced    Spouse name: Not on file  . Number of children: 1  . Years of education: Not on file  . Highest  education level: Not on file  Occupational History  . Occupation: Engineering geologist    Comment: Retired from [employer]    Employer: RETIRED  Social Needs  . Financial resource strain: Not on file  . Food insecurity:    Worry: Not on file    Inability: Not on file  . Transportation needs:    Medical: Not on file    Non-medical: Not on file  Tobacco Use  . Smoking status: Never Smoker  . Smokeless tobacco: Never Used  Substance and Sexual Activity  . Alcohol use: No    Alcohol/week: 0.0 standard drinks  . Drug use: No  . Sexual activity: Not on file  Lifestyle  . Physical activity:    Days per week: Not on file    Minutes per session: Not on file  . Stress: Not on file  Relationships  . Social connections:    Talks on phone: Not on file    Gets together: Not on file    Attends religious service: Not on file    Active member of club or organization: Not on file    Attends meetings of clubs or organizations: Not on file    Relationship status:  Not on file  . Intimate partner violence:    Fear of current or ex partner: Not on file    Emotionally abused: Not on file    Physically abused: Not on file    Forced sexual activity: Not on file  Other Topics Concern  . Not on file  Social History Narrative   Widowed twice then divorced   One son      No living will   Would have son be health care POA if needed   Not sure about DNR-- very adamant about not being kept on life support   No feeding tube!!     Constitutional: Denies fever, malaise, fatigue, headache or abrupt weight changes.  Respiratory: Denies difficulty breathing, shortness of breath, cough or sputum production.   Cardiovascular: Denies chest pain, chest tightness, palpitations or swelling in the hands or feet.  Skin: Pt reports rash of right arm. Denies ulcercations.    No other specific complaints in a complete review of systems (except as listed in HPI above).  Objective:   Physical Exam   BP 118/74   Pulse 68    Temp 97.9 F (36.6 C) (Oral)   Ht 5' 3.25" (1.607 m)   Wt 190 lb (86.2 kg)   SpO2 95%   BMI 33.39 kg/m  Wt Readings from Last 3 Encounters:  02/27/18 190 lb (86.2 kg)  03/02/17 179 lb (81.2 kg)  12/13/15 184 lb (83.5 kg)    General: Appears her stated age, well developed, well nourished in NAD. Skin: Vesicular rash on erythematous base noted starting at right shoulder, extending down to right wrist, following the C7 dermatome.  BMET    Component Value Date/Time   NA 140 03/02/2017 1610   K 4.8 03/02/2017 1610   CL 103 03/02/2017 1610   CO2 28 03/02/2017 1610   GLUCOSE 92 03/02/2017 1610   BUN 16 03/02/2017 1610   CREATININE 1.12 03/02/2017 1610   CALCIUM 9.8 03/02/2017 1610    Lipid Panel     Component Value Date/Time   CHOL 149 03/02/2017 1610   TRIG 78.0 03/02/2017 1610   HDL 52.50 03/02/2017 1610   CHOLHDL 3 03/02/2017 1610   VLDL 15.6 03/02/2017 1610   LDLCALC 81 03/02/2017 1610    CBC    Component Value Date/Time   WBC 4.6 03/02/2017 1610   RBC 4.61 03/02/2017 1610   HGB 14.0 03/02/2017 1610   HCT 41.8 03/02/2017 1610   PLT 168.0 03/02/2017 1610   MCV 90.7 03/02/2017 1610   MCH 30.4 06/11/2014 0305   MCHC 33.6 03/02/2017 1610   RDW 13.6 03/02/2017 1610   LYMPHSABS 1.6 12/13/2015 1552   MONOABS 0.6 12/13/2015 1552   EOSABS 0.0 12/13/2015 1552   BASOSABS 0.0 12/13/2015 1552    Hgb A1C No results found for: HGBA1C        Assessment & Plan:   Herpes Zoster:  RX for Valtrex 1 gm TID x 7 days She declines RX for Neurontin for shingles pain Advised her to stay away from pregnant women, children < 1 year, immunocompromised patients  Return precautions discussed Nicki Reaper, NP

## 2018-02-27 NOTE — Patient Instructions (Signed)
Shingles    Shingles is an infection. It gives you a painful skin rash and blisters that have fluid in them. Shingles is caused by the same germ (virus) that causes chickenpox.  Shingles only happens in people who:   Have had chickenpox.   Have been given a shot of medicine (vaccine) to protect against chickenpox. Shingles is rare in this group.  The first symptoms of shingles may be itching, tingling, or pain in an area on your skin. A rash will show on your skin a few days or weeks later. The rash is likely to be on one side of your body. The rash usually has a shape like a belt or a band. Over time, the rash turns into fluid-filled blisters. The blisters will break open, change into scabs, and dry up. Medicines may:   Help with pain and itching.   Help you get better sooner.   Help to prevent long-term problems.  Follow these instructions at home:  Medicines   Take over-the-counter and prescription medicines only as told by your doctor.   Put on an anti-itch cream or numbing cream where you have a rash, blisters, or scabs. Do this as told by your doctor.  Helping with itching and discomfort     Put cold, wet cloths (cold compresses) on the area of the rash or blisters as told by your doctor.   Cool baths can help you feel better. Try adding baking soda or dry oatmeal to the water to lessen itching. Do not bathe in hot water.  Blister and rash care   Keep your rash covered with a loose bandage (dressing).   Wear loose clothing that does not rub on your rash.   Keep your rash and blisters clean. To do this, wash the area with mild soap and cool water as told by your doctor.   Check your rash every day for signs of infection. Check for:  ? More redness, swelling, or pain.  ? Fluid or blood.  ? Warmth.  ? Pus or a bad smell.   Do not scratch your rash. Do not pick at your blisters. To help you to not scratch:  ? Keep your fingernails clean and cut short.  ? Wear gloves or mittens when you sleep, if  scratching is a problem.  General instructions   Rest as told by your doctor.   Keep all follow-up visits as told by your doctor. This is important.   Wash your hands often with soap and water. If soap and water are not available, use hand sanitizer. Doing this lowers your chance of getting a skin infection caused by germs (bacteria).   Your infection can cause chickenpox in people who have never had chickenpox or never got a shot of chickenpox vaccine. If you have blisters that did not change into scabs yet, try not to touch other people or be around other people, especially:  ? Babies.  ? Pregnant women.  ? Children who have areas of red, itchy, or rough skin (eczema).  ? Very old people who have transplants.  ? People who have a long-term (chronic) sickness, like cancer or AIDS.  Contact a doctor if:   Your pain does not get better with medicine.   Your pain does not get better after the rash heals.   You have any signs of infection in the rash area. These signs include:  ? More redness, swelling, or pain around the rash.  ? Fluid or blood coming from   the rash.  ? The rash area feeling warm to the touch.  ? Pus or a bad smell coming from the rash.  Get help right away if:   The rash is on your face or nose.   You have pain in your face or pain by your eye.   You lose feeling on one side of your face.   You have trouble seeing.   You have ear pain, or you have ringing in your ear.   You have a loss of taste.   Your condition gets worse.  Summary   Shingles gives you a painful skin rash and blisters that have fluid in them.   Shingles is an infection. It is caused by the same germ (virus) that causes chickenpox.   Keep your rash covered with a loose bandage (dressing). Wear loose clothing that does not rub on your rash.   If you have blisters that did not change into scabs yet, try not to touch other people or be around people.  This information is not intended to replace advice given to you by  your health care provider. Make sure you discuss any questions you have with your health care provider.  Document Released: 06/11/2007 Document Revised: 08/27/2016 Document Reviewed: 08/27/2016  Elsevier Interactive Patient Education  2019 Elsevier Inc[employer]

## 2018-03-01 ENCOUNTER — Telehealth: Payer: Self-pay

## 2018-03-01 MED ORDER — GABAPENTIN 100 MG PO CAPS: 100.0000 mg | ORAL_CAPSULE | Freq: Every day | ORAL | 0 refills | Status: DC

## 2018-03-01 NOTE — Addendum Note (Signed)
Addended by: Lorre Munroe on: 03/01/2018 09:57 AM   Modules accepted: Orders

## 2018-03-01 NOTE — Telephone Encounter (Signed)
Noted Will review with her at this week's visit

## 2018-03-01 NOTE — Telephone Encounter (Signed)
Pt was seen at [facility] on 02/27/18; pt declined the Neurontin rx but has not slept due to shingles pain in arm since seen on 02/27/18. Tylenol is not helping. Pt [facility] [address] Pt does have AWV to see Dr Alphonsus Sias on 03/04/18. Pt said the rash appears the same as when seen on 02/27/18. The rash is from shoulder to thumb; no blisters seen at this time.

## 2018-03-01 NOTE — Telephone Encounter (Signed)
[facility]

## 2018-03-01 NOTE — Telephone Encounter (Signed)
[facility] Night - Client TELEPHONE ADVICE RECORD [facility] Call [facility] Patient Name: Kathryn Lopez Gender: Female DOB: 1940/02/05 Age: 78 Y 4 M 1 D Return Phone Number: [phone] (Primary) Address: City/State/Zip: [place] [address] Client [facility] Night - Client Client Site [facility] - Night Physician Tillman Abide - MD Contact Type Call Who Is Calling Patient / Member / Family / Caregiver Call Type Triage / Clinical Relationship To Patient Self Return Phone Number [phone] (Primary) Chief Complaint Arm Pain (no known cause) Reason for Call Symptomatic / Request for Health Information Initial Comment Caller states her right arm is sore, and a rash broke out on it. Concerned it may be Shingles. Translation No Nurse Assessment Nurse: Elroy Channel, RN, Rosey Bath Date/Time Lamount Cohen Time): 02/27/2018 8:36:38 AM Confirm and document reason for call. If symptomatic, describe symptoms. ---Caller states that she is having pain in right arm. Has rash that is little red patches. Has the patient traveled to [place] OR had close contact with a person known to have the novel coronavirus illness in the last 14 days? ---No Does the patient have any new or worsening symptoms? ---Yes Will a triage be completed? ---Yes Related visit to physician within the last 2 weeks? ---No Does the PT have any chronic conditions? (i.e. diabetes, asthma, this includes High risk factors for pregnancy, etc.) ---Yes List chronic conditions. ---hypertension, elevated cholesterol Is this a behavioral health or substance abuse call? ---No Guidelines Guideline Title Affirmed Question Affirmed Notes Nurse Date/Time (Eastern Time) Rash or Redness - Widespread Rash looks like large or small blisters (i.e., fluid filled bubbles or sacs on the skin) Elroy Channel, RN, Rosey Bath 02/27/2018 8:39:07 AM Disp. Time Lamount Cohen Time) Disposition Final  User 02/27/2018 8:42:08 AM Go to ED Now (or PCP triage) Yes Elroy Channel, RN, Rosey Bath PLEASE NOTE: All timestamps contained within this report are represented as [place] Standard Time. CONFIDENTIALTY NOTICE: This fax transmission is intended only for the addressee. It contains information that is legally privileged, confidential or otherwise protected from use or disclosure. If you are not the intended recipient, you are strictly prohibited from reviewing, disclosing, copying using or disseminating any of this information or taking any action in reliance on or regarding this information. If you have received this fax in error, please notify us immediately by telephone so that we can arrange for its return to [place]. Phone: [phone], Toll-Free: [phone], Fax: [phone] Page: 2 of 2 Call Id: [id number] Caller Disagree/Comply Comply Caller Understands Yes PreDisposition Did not know what to do Care Advice Given Per Guideline GO TO ED NOW (OR PCP TRIAGE): BRING MEDICINES: * Please bring a list of your current medicines when you go to see the doctor. CARE ADVICE given per Rash - Widespread and Cause Unknown (Adult) guideline. Referrals [facility]

## 2018-03-01 NOTE — Telephone Encounter (Signed)
Per chart review tab pt was seen [facility]/27/[facility] at [facility] and pt has AWV already scheduled with Dr Alphonsus Sias on 03/04/18.

## 2018-03-04 ENCOUNTER — Ambulatory Visit (INDEPENDENT_AMBULATORY_CARE_PROVIDER_SITE_OTHER): Payer: Medicare Other | Admitting: Internal Medicine

## 2018-03-04 ENCOUNTER — Encounter: Payer: Self-pay | Admitting: Internal Medicine

## 2018-03-04 VITALS — BP 134/84 | HR 53 | Temp 97.6°F | Ht 62.5 in | Wt 188.8 lb

## 2018-03-04 DIAGNOSIS — I10 Essential (primary) hypertension: Secondary | ICD-10-CM | POA: Diagnosis not present

## 2018-03-04 DIAGNOSIS — E785 Hyperlipidemia, unspecified: Secondary | ICD-10-CM | POA: Diagnosis not present

## 2018-03-04 DIAGNOSIS — B029 Zoster without complications: Secondary | ICD-10-CM | POA: Diagnosis not present

## 2018-03-04 DIAGNOSIS — B019 Varicella without complication: Secondary | ICD-10-CM

## 2018-03-04 DIAGNOSIS — Z0000 Encounter for general adult medical examination without abnormal findings: Secondary | ICD-10-CM

## 2018-03-04 DIAGNOSIS — Z7189 Other specified counseling: Secondary | ICD-10-CM

## 2018-03-04 DIAGNOSIS — M159 Polyosteoarthritis, unspecified: Secondary | ICD-10-CM

## 2018-03-04 DIAGNOSIS — M150 Primary generalized (osteo)arthritis: Secondary | ICD-10-CM

## 2018-03-04 LAB — COMPREHENSIVE METABOLIC PANEL
ALK PHOS: 63 U/L (ref 39–117)
ALT: 24 U/L (ref 0–35)
AST: 25 U/L (ref 0–37)
Albumin: 4.6 g/dL (ref 3.5–5.2)
BILIRUBIN TOTAL: 0.6 mg/dL (ref 0.2–1.2)
BUN: 11 mg/dL (ref 6–23)
CO2: 29 meq/L (ref 19–32)
Calcium: 9.3 mg/dL (ref 8.4–10.5)
Chloride: 98 mEq/L (ref 96–112)
Creatinine, Ser: 1.1 mg/dL (ref 0.40–1.20)
GFR: 47.87 mL/min — AB (ref >60.00)
GLUCOSE: 96 mg/dL (ref 70–99)
Potassium: 4.5 mEq/L (ref 3.5–5.1)
Sodium: 133 mEq/L — ABNORMAL LOW (ref 135–145)
TOTAL PROTEIN: 7.7 g/dL (ref 6.0–8.3)

## 2018-03-04 LAB — CBC
HCT: 43.9 % (ref 36.0–46.0)
HEMOGLOBIN: 14.8 g/dL (ref 12.0–15.0)
MCHC: 33.6 g/dL (ref 30.0–36.0)
MCV: 91.3 fl (ref 78.0–100.0)
PLATELETS: 199 10*3/uL (ref 150.0–400.0)
RBC: 4.81 Mil/uL (ref 3.87–5.11)
RDW: 13 % (ref 11.5–15.5)
WBC: 6.1 10*3/uL (ref 4.0–10.5)

## 2018-03-04 LAB — LIPID PANEL
CHOL/HDL RATIO: 3
Cholesterol: 154 mg/dL (ref 0–200)
HDL: 48.8 mg/dL (ref >39.00)
LDL Cholesterol: 82 mg/dL (ref 0–99)
NONHDL: 105.45
Triglycerides: 118 mg/dL (ref 0.0–149.0)
VLDL: 23.6 mg/dL (ref 0.0–40.0)

## 2018-03-04 MED ORDER — LOSARTAN POTASSIUM 50 MG PO TABS: 50.0000 mg | ORAL_TABLET | Freq: Every day | ORAL | 3 refills | Status: DC

## 2018-03-04 MED ORDER — SIMVASTATIN 20 MG PO TABS: ORAL_TABLET | ORAL | 3 refills | Status: DC

## 2018-03-04 MED ORDER — METOPROLOL TARTRATE 50 MG PO TABS: 50.0000 mg | ORAL_TABLET | Freq: Two times a day (BID) | ORAL | 3 refills | Status: DC

## 2018-03-04 NOTE — Assessment & Plan Note (Signed)
Mild uses tylenol prn

## 2018-03-04 NOTE — Assessment & Plan Note (Signed)
BP Readings from Last 3 Encounters:  03/04/18 134/84  02/27/18 118/74  03/02/17 140/80   Good control Due for labs

## 2018-03-04 NOTE — Progress Notes (Signed)
Subjective:    Patient ID: Kathryn Lopez, female    DOB: 1940-09-05, 78 y.o.   MRN: [id number]  HPI Here for Medicare wellness visit and follow up of chronic health conditions Reviewed form and advanced directives Reviewed other doctors No alcohol or tobacco Tries to walk regularly Doesn't drive--but independent with instrumental ADLs No falls No sig depression or anhedonia Vision is fine. Hearing is good No apparent sig memory problems  Shingles is improved No new lesions Pain is some better--but some burning still Slight headache with this  Checks BP at times Highest is 136/70's No chest pain No SOB No palpitations No sig edema  Still on statin No myalgias or GI problems  Current Outpatient Medications on File Prior to Visit  Medication Sig Dispense Refill  . acetaminophen (TYLENOL) 325 MG tablet Take 650 mg by mouth 3 (three) times daily as needed for mild pain.    . cholecalciferol (VITAMIN D) 1000 UNITS tablet Take 1,000 Units by mouth daily.    Marland Kitchen gabapentin (NEURONTIN) 100 MG capsule Take 1 capsule (100 mg total) by mouth at bedtime. 30 capsule 0  . losartan (COZAAR) 50 MG tablet Take 1 tablet (50 mg total) by mouth daily. 90 tablet 1  . metoprolol tartrate (LOPRESSOR) 50 MG tablet Take 1 tablet (50 mg total) by mouth 2 (two) times daily. 180 tablet 1  . simvastatin (ZOCOR) 20 MG tablet Take 1 tablet by mouth daily 90 tablet 1  . valACYclovir (VALTREX) 1000 MG tablet Take 1 tablet (1,000 mg total) by mouth 3 (three) times daily. 21 tablet 0   No current facility-administered medications on file prior to visit.     No Known Allergies  Past Medical History:  Diagnosis Date  . Hyperlipidemia   . Hypertension   . Osteoarthritis, multiple sites     Past Surgical History:  Procedure Laterality Date  . APPENDECTOMY    . CATARACT EXTRACTION W/ INTRAOCULAR LENS  IMPLANT, BILATERAL Bilateral 2012  . KNEE ARTHROSCOPY Left 2017  . TONSILLECTOMY       Family History  Problem Relation Age of Onset  . Stroke Father   . Hypertension Sister   . Heart disease Sister        heart valve replaced  . Diabetes Brother   . Hypertension Brother   . Diabetes Brother   . Hypertension Brother   . Hypertension Sister   . Hypertension Sister   . Hypertension Sister   . Cancer Maternal Grandmother        breast cancer    Social History   Socioeconomic History  . Marital status: Divorced    Spouse name: Not on file  . Number of children: 1  . Years of education: Not on file  . Highest education level: Not on file  Occupational History  . Occupation: Engineering geologist    Comment: Retired from [employer]    Employer: RETIRED  Social Needs  . Financial resource strain: Not on file  . Food insecurity:    Worry: Not on file    Inability: Not on file  . Transportation needs:    Medical: Not on file    Non-medical: Not on file  Tobacco Use  . Smoking status: Never Smoker  . Smokeless tobacco: Never Used  Substance and Sexual Activity  . Alcohol use: No    Alcohol/week: 0.0 standard drinks  . Drug use: No  . Sexual activity: Not on file  Lifestyle  . Physical activity:  Days per week: Not on file    Minutes per session: Not on file  . Stress: Not on file  Relationships  . Social connections:    Talks on phone: Not on file    Gets together: Not on file    Attends religious service: Not on file    Active member of club or organization: Not on file    Attends meetings of clubs or organizations: Not on file    Relationship status: Not on file  . Intimate partner violence:    Fear of current or ex partner: Not on file    Emotionally abused: Not on file    Physically abused: Not on file    Forced sexual activity: Not on file  Other Topics Concern  . Not on file  Social History Narrative   Widowed twice then divorced   One son      No living will   Would have son be health care POA if needed   Not sure about DNR-- very adamant about  not being kept on life support   No feeding tube!!   Review of Systems Appetite is fine Has gained back the weight she lost last year Usually sleeps okay. Hard with shingles pain though gabapentin helping Wears seat belt Full dentures Some dry skin--no suspicious lesions bowels are fine--no blood. Eats prunes No heartburn or dysphagia Some urinary urgency--no incontinence. No dysuria Mild joint pain only--uses tylenol prn      Objective:   Physical Exam  Constitutional: She is oriented to person, place, and time. She appears well-developed. No distress.  HENT:  Mouth/Throat: Oropharynx is clear and moist. No oropharyngeal exudate.  Neck: No thyromegaly present.  Cardiovascular: Normal rate, regular rhythm, normal heart sounds and intact distal pulses. Exam reveals no gallop.  No murmur heard. Respiratory: Effort normal and breath sounds normal. No respiratory distress. She has no wheezes. She has no rales.  GI: Soft. There is no abdominal tenderness.  Musculoskeletal:        General: No tenderness or edema.  Lymphadenopathy:    She has no cervical adenopathy.  Neurological: She is alert and oriented to person, place, and time.  President--- "Benson [place], [place]" [phone] [id number] Recall 3/3  Skin: No erythema.  No active zoster lesions now (on right arm/forearm)  Psychiatric: She has a normal mood and affect. Her behavior is normal.           Assessment & Plan:

## 2018-03-04 NOTE — Assessment & Plan Note (Signed)
No active lesions now Finish valacyclovir Wean off gabapentin when pain/burning gone

## 2018-03-04 NOTE — Assessment & Plan Note (Signed)
See social history 

## 2018-03-04 NOTE — Assessment & Plan Note (Signed)
I have personally reviewed the Medicare Annual Wellness questionnaire and have noted 1. The patient's medical and social history 2. Their use of alcohol, tobacco or illicit drugs 3. Their current medications and supplements 4. The patient's functional ability including ADL's, fall risks, home safety risks and hearing or visual             impairment. 5. Diet and physical activities 6. Evidence for depression or mood disorders  The patients weight, height, BMI and visual acuity have been recorded in the chart I have made referrals, counseling and provided education to the patient based review of the above and I have provided the pt with a written personalized care plan for preventive services.  I have provided you with a copy of your personalized plan for preventive services. Please take the time to review along with your updated medication list.  Done with cancer screening due to age Will consider shingrix in the future Yearly flu vaccine Discussed increased exercise

## 2018-03-04 NOTE — Assessment & Plan Note (Signed)
Doing well with primary prevention 

## 2018-03-08 ENCOUNTER — Ambulatory Visit: Payer: Self-pay | Admitting: Internal Medicine

## 2018-03-15 ENCOUNTER — Telehealth: Payer: Self-pay

## 2018-03-15 NOTE — Telephone Encounter (Signed)
Spoke to pt. She will increase it as directed. Will let us know if she continues to have pain. She had asked how long could this go on and I advised her it is different for everyone.

## 2018-03-15 NOTE — Telephone Encounter (Signed)
Have her increase the gabapentin to 100mg  tid. She can even increase the evening dose to 200mg  and then 300mg ---waiting 2 days before any changes (change to 100 tid now) Send more if needed

## 2018-03-15 NOTE — Telephone Encounter (Signed)
Pt was seen on 03/04/18; pt said rt arm still shaking when tries to eat and pt is still hurting from the shingles; pt is presently taking gabapentin 100 mg one capsule at hs.pt is also taking 2 tabs of tylenol q 4 - 5 hrs without relief of pain. Pt wants to know if there is something else pt could take for the pain. The rash appears to be drying up. Pt has finished [facility]ovir. [facility] [address] Pt request cb.

## 2018-03-17 MED ORDER — GABAPENTIN 100 MG PO CAPS: ORAL_CAPSULE | ORAL | 3 refills | Status: DC

## 2018-03-17 NOTE — Telephone Encounter (Signed)
Please send new Rx Make it 100mg  bid and 200mg  at bedtime  #120 x 3 Let her know to try the higher dose at bedtime now

## 2018-03-17 NOTE — Addendum Note (Signed)
Addended by: Eual Fines on: 03/17/2018 10:27 AM   Modules accepted: Orders

## 2018-03-17 NOTE — Addendum Note (Signed)
Addended by: Patience Musca on: 03/17/2018 09:44 AM   Modules accepted: Orders

## 2018-03-17 NOTE — Telephone Encounter (Signed)
I spoke with pt and she is about out of gabapentin since increasing the gabapentin 100 mg tid. [facility] [address] Please advise. Pt said she is still in pain but is better since increasing gabapentin 100 mg to tid.Please advise.

## 2018-03-17 NOTE — Telephone Encounter (Signed)
Left detailed message on VM per DPR. Rx sent.

## 2018-03-23 ENCOUNTER — Other Ambulatory Visit: Payer: Self-pay | Admitting: Internal Medicine

## 2018-04-05 ENCOUNTER — Telehealth: Payer: Self-pay | Admitting: *Deleted

## 2018-04-05 NOTE — Telephone Encounter (Signed)
Spoke to pt. She found some generic. Just wanted to make sure that was okay to take. I told her it was.

## 2018-04-05 NOTE — Telephone Encounter (Signed)
Patient left a voicemail stating that she is having a hard time finding Tylenol. Patient wants to know what you would recommend that she take since she can not find Tylenol?

## 2018-04-05 NOTE — Telephone Encounter (Signed)
I think she should be able to find generic acetaminophen somewhere. No other comparable alternatives

## 2018-05-05 ENCOUNTER — Other Ambulatory Visit: Payer: Self-pay | Admitting: Internal Medicine

## 2018-09-27 ENCOUNTER — Telehealth: Payer: Self-pay | Admitting: Internal Medicine

## 2018-09-27 MED ORDER — SIMVASTATIN 20 MG PO TABS: ORAL_TABLET | ORAL | 3 refills | Status: DC

## 2018-09-27 MED ORDER — METOPROLOL TARTRATE 50 MG PO TABS: 50.0000 mg | ORAL_TABLET | Freq: Two times a day (BID) | ORAL | 3 refills | Status: DC

## 2018-09-27 MED ORDER — LOSARTAN POTASSIUM 50 MG PO TABS: 50.0000 mg | ORAL_TABLET | Freq: Every day | ORAL | 3 refills | Status: DC

## 2018-09-27 NOTE — Telephone Encounter (Signed)
Rxs sent to [facility] as requested. 

## 2018-09-27 NOTE — Telephone Encounter (Signed)
Best number [phone]  Pt called needing rx sent to [facility] Pt is changing pharmacy  Losartan Simvastatin   Metoprolol tartrate

## 2018-10-04 DIAGNOSIS — Z1231 Encounter for screening mammogram for malignant neoplasm of breast: Secondary | ICD-10-CM | POA: Diagnosis not present

## 2018-11-03 ENCOUNTER — Encounter: Payer: Self-pay | Admitting: Internal Medicine

## 2019-03-07 ENCOUNTER — Encounter: Payer: Medicare Other | Admitting: Internal Medicine

## 2019-03-14 ENCOUNTER — Ambulatory Visit (INDEPENDENT_AMBULATORY_CARE_PROVIDER_SITE_OTHER): Payer: Medicare Other | Admitting: Internal Medicine

## 2019-03-14 ENCOUNTER — Encounter: Payer: Self-pay | Admitting: Internal Medicine

## 2019-03-14 ENCOUNTER — Other Ambulatory Visit: Payer: Self-pay

## 2019-03-14 VITALS — BP 138/84 | HR 49 | Temp 97.1°F | Ht 63.0 in | Wt 194.0 lb

## 2019-03-14 DIAGNOSIS — M150 Primary generalized (osteo)arthritis: Secondary | ICD-10-CM

## 2019-03-14 DIAGNOSIS — I10 Essential (primary) hypertension: Secondary | ICD-10-CM

## 2019-03-14 DIAGNOSIS — M159 Polyosteoarthritis, unspecified: Secondary | ICD-10-CM

## 2019-03-14 DIAGNOSIS — Z0000 Encounter for general adult medical examination without abnormal findings: Secondary | ICD-10-CM

## 2019-03-14 DIAGNOSIS — N1831 Chronic kidney disease, stage 3a: Secondary | ICD-10-CM | POA: Diagnosis not present

## 2019-03-14 DIAGNOSIS — E785 Hyperlipidemia, unspecified: Secondary | ICD-10-CM

## 2019-03-14 DIAGNOSIS — Z7189 Other specified counseling: Secondary | ICD-10-CM

## 2019-03-14 DIAGNOSIS — M8949 Other hypertrophic osteoarthropathy, multiple sites: Secondary | ICD-10-CM | POA: Diagnosis not present

## 2019-03-14 NOTE — Assessment & Plan Note (Signed)
Is on ARB--will check labs

## 2019-03-14 NOTE — Progress Notes (Signed)
Hearing Screening   125Hz  250Hz  500Hz  1000Hz  2000Hz  3000Hz  4000Hz  6000Hz  8000Hz   Right ear:   20 20 20  20     Left ear:   0 0 20  20      Visual Acuity Screening   Right eye Left eye Both eyes  Without correction:     With correction: 20/40 20/20 20/30

## 2019-03-14 NOTE — Patient Instructions (Signed)
Please set up and get your COVID vaccines as soon as possible. Sometime after 2 weeks have passed since your second COVID vaccine--you can then get the shingrix vaccine at your pharmacy.

## 2019-03-14 NOTE — Assessment & Plan Note (Signed)
No problems with statin for primary prevention 

## 2019-03-14 NOTE — Assessment & Plan Note (Signed)
BP Readings from Last 3 Encounters:  03/14/19 138/84  03/04/18 134/84  02/27/18 118/74   Good control No changes needed  will check labs

## 2019-03-14 NOTE — Assessment & Plan Note (Signed)
I have personally reviewed the Medicare Annual Wellness questionnaire and have noted 1. The patient's medical and social history 2. Their use of alcohol, tobacco or illicit drugs 3. Their current medications and supplements 4. The patient's functional ability including ADL's, fall risks, home safety risks and hearing or visual             impairment. 5. Diet and physical activities 6. Evidence for depression or mood disorders  The patients weight, height, BMI and visual acuity have been recorded in the chart I have made referrals, counseling and provided education to the patient based review of the above and I have provided the pt with a written personalized care plan for preventive services.  I have provided you with a copy of your personalized plan for preventive services. Please take the time to review along with your updated medication list.  Healthy but needs to work on fitness Should get COVID vaccine ASAP--then can get shingrix after No cancer screening Yearly flu vaccine

## 2019-03-14 NOTE — Progress Notes (Signed)
Subjective:    Patient ID: [name], female    DOB: 1940-04-14, 79 y.o.   MRN: [id number]  HPI Here for Medicare wellness visit and follow up of chronic health conditions This visit occurred during the SARS-CoV-2 public health emergency.  Safety protocols were in place, including screening questions prior to the visit, additional usage of staff PPE, and extensive cleaning of exam room while observing appropriate contact time as indicated for disinfecting solutions.   Reviewed form and advanced directives Reviewed other doctors No alcohol or tobacco No true exercise---discussed. Does some yard work Doesn't drive---does all instrumental ADLs though Vision is okay Some hearing problems---left ear not as good but she doesn't note functional problems No falls No depression and not really anhedonic No apparent memory issues  Has been staying in Son brings her groceries--she does her cooking Hasn't had COVID vaccine  No chest pain No SOB No dizziness or syncope No headaches No edema Known CKD---last GFR 47  Still on statin No apparent problems  Does have some knee pain---does affect her walking at times Uses tylenol occasionally  Current Outpatient Medications on File Prior to Visit  Medication Sig Dispense Refill  . acetaminophen (TYLENOL) 325 MG tablet Take 650 mg by mouth 3 (three) times daily as needed for mild pain.    . cholecalciferol (VITAMIN D) 1000 UNITS tablet Take 1,000 Units by mouth daily.    Marland Kitchen losartan (COZAAR) 50 MG tablet Take 1 tablet (50 mg total) by mouth daily. 90 tablet 3  . metoprolol tartrate (LOPRESSOR) 50 MG tablet Take 1 tablet (50 mg total) by mouth 2 (two) times daily. 180 tablet 3  . simvastatin (ZOCOR) 20 MG tablet Take 1 tablet by mouth daily 90 tablet 3   No current facility-administered medications on file prior to visit.    No Known Allergies  Past Medical History:  Diagnosis Date  . Hyperlipidemia   . Hypertension   .  Osteoarthritis, multiple sites     Past Surgical History:  Procedure Laterality Date  . APPENDECTOMY    . CATARACT EXTRACTION W/ INTRAOCULAR LENS  IMPLANT, BILATERAL Bilateral 2012  . KNEE ARTHROSCOPY Left 2017  . TONSILLECTOMY      Family History  Problem Relation Age of Onset  . Stroke Father   . Hypertension Sister   . Heart disease Sister        heart valve replaced  . Diabetes Brother   . Hypertension Brother   . Diabetes Brother   . Hypertension Brother   . Hypertension Sister   . Hypertension Sister   . Hypertension Sister   . Cancer Maternal Grandmother        breast cancer    Social History   Socioeconomic History  . Marital status: Divorced    Spouse name: Not on file  . Number of children: 1  . Years of education: Not on file  . Highest education level: Not on file  Occupational History  . Occupation: Engineering geologist    Comment: Retired from [employer]    Employer: RETIRED  Tobacco Use  . Smoking status: Never Smoker  . Smokeless tobacco: Never Used  Substance and Sexual Activity  . Alcohol use: No    Alcohol/week: 0.0 standard drinks  . Drug use: No  . Sexual activity: Not on file  Other Topics Concern  . Not on file  Social History Narrative   Widowed twice then divorced   One son      No living will  Would have son be health care POA if needed   Not sure about DNR-- very adamant about not being kept on life support   No feeding tube!!   Social Determinants of Health   Financial Resource Strain:   . Difficulty of Paying Living Expenses: Not on file  Food Insecurity:   . Worried About Charity fundraiser in the Last Year: Not on file  . Ran Out of Food in the Last Year: Not on file  Transportation Needs:   . Lack of Transportation (Medical): Not on file  . Lack of Transportation (Non-Medical): Not on file  Physical Activity:   . Days of Exercise per Week: Not on file  . Minutes of Exercise per Session: Not on file  Stress:   . Feeling of Stress  : Not on file  Social Connections:   . Frequency of Communication with Friends and Family: Not on file  . Frequency of Social Gatherings with Friends and Family: Not on file  . Attends Religious Services: Not on file  . Active Member of Clubs or Organizations: Not on file  . Attends Archivist Meetings: Not on file  . Marital Status: Not on file  Intimate Partner Violence:   . Fear of Current or Ex-Partner: Not on file  . Emotionally Abused: Not on file  . Physically Abused: Not on file  . Sexually Abused: Not on file   Review of Systems Appetite is good Weight is up slightly Sleeps okay Wears seat belt Dentures--top and bottom. No dentist No significant skin issues---some chronic red spots at times (no derm) Bowels move fine with prunes. No blood Voids fine-- some urgency but no incontinence No heartburn or dysphagia    Objective:   Physical Exam  Constitutional: She is oriented to person, place, and time. She appears well-developed. No distress.  HENT:  Mouth/Throat: Oropharynx is clear and moist. No oropharyngeal exudate.  No oral lesions  Neck: No thyromegaly present.  Cardiovascular: Normal rate, regular rhythm and normal heart sounds. Exam reveals no gallop.  No murmur heard. Faint pedal pulses  Respiratory: Effort normal and breath sounds normal. No respiratory distress. She has no wheezes. She has no rales.  GI: Soft. There is no abdominal tenderness.  Musculoskeletal:        General: No tenderness or edema.  Lymphadenopathy:    She has no cervical adenopathy.  Neurological: She is alert and oriented to person, place, and time.  President--- "not Trump anymore, ?" [id number] D-l-r-o-w Recall 3/3  Skin:  Scattered pale red macules  Psychiatric: She has a normal mood and affect. Her behavior is normal.           Assessment & Plan:

## 2019-03-14 NOTE — Assessment & Plan Note (Signed)
See social history Blank forms given 

## 2019-03-14 NOTE — Assessment & Plan Note (Signed)
Mild  Uses tylenol prn 

## 2019-03-15 LAB — LIPID PANEL
Cholesterol: 148 mg/dL (ref 0–200)
HDL: 48.1 mg/dL (ref >39.00)
LDL Cholesterol: 83 mg/dL (ref 0–99)
NonHDL: 100.11
Total CHOL/HDL Ratio: 3
Triglycerides: 84 mg/dL (ref 0.0–149.0)
VLDL: 16.8 mg/dL (ref 0.0–40.0)

## 2019-03-15 LAB — RENAL FUNCTION PANEL
Albumin: 4.4 g/dL (ref 3.5–5.2)
BUN: 16 mg/dL (ref 6–23)
CO2: 29 mEq/L (ref 19–32)
Calcium: 9.5 mg/dL (ref 8.4–10.5)
Chloride: 102 mEq/L (ref 96–112)
Creatinine, Ser: 1.11 mg/dL (ref 0.40–1.20)
GFR: 47.25 mL/min — ABNORMAL LOW (ref >60.00)
Glucose, Bld: 91 mg/dL (ref 70–99)
Phosphorus: 3.7 mg/dL (ref 2.3–4.6)
Potassium: 4.7 mEq/L (ref 3.5–5.1)
Sodium: 137 mEq/L (ref 135–145)

## 2019-03-15 LAB — CBC
HCT: 43.7 % (ref 36.0–46.0)
Hemoglobin: 14.5 g/dL (ref 12.0–15.0)
MCHC: 33.2 g/dL (ref 30.0–36.0)
MCV: 91.8 fl (ref 78.0–100.0)
Platelets: 181 10*3/uL (ref 150.0–400.0)
RBC: 4.76 Mil/uL (ref 3.87–5.11)
RDW: 13 % (ref 11.5–15.5)
WBC: 5.3 10*3/uL (ref 4.0–10.5)

## 2019-03-15 LAB — HEPATIC FUNCTION PANEL
ALT: 22 U/L (ref 0–35)
AST: 23 U/L (ref 0–37)
Albumin: 4.4 g/dL (ref 3.5–5.2)
Alkaline Phosphatase: 72 U/L (ref 39–117)
Bilirubin, Direct: 0.1 mg/dL (ref 0.0–0.3)
Total Bilirubin: 0.7 mg/dL (ref 0.2–1.2)
Total Protein: 7.6 g/dL (ref 6.0–8.3)

## 2019-03-15 LAB — T4, FREE: Free T4: 0.73 ng/dL (ref 0.60–1.60)

## 2019-07-18 ENCOUNTER — Other Ambulatory Visit: Payer: Self-pay | Admitting: Internal Medicine

## 2019-11-28 DIAGNOSIS — Z1231 Encounter for screening mammogram for malignant neoplasm of breast: Secondary | ICD-10-CM | POA: Diagnosis not present

## 2020-03-15 ENCOUNTER — Encounter: Payer: Medicare Other | Admitting: Internal Medicine

## 2020-03-26 ENCOUNTER — Encounter: Payer: Self-pay | Admitting: Internal Medicine

## 2020-03-26 ENCOUNTER — Other Ambulatory Visit: Payer: Self-pay

## 2020-03-26 ENCOUNTER — Ambulatory Visit (INDEPENDENT_AMBULATORY_CARE_PROVIDER_SITE_OTHER): Payer: Medicare Other | Admitting: Internal Medicine

## 2020-03-26 VITALS — BP 140/84 | HR 55 | Temp 97.5°F | Ht 63.0 in | Wt 196.0 lb

## 2020-03-26 DIAGNOSIS — I10 Essential (primary) hypertension: Secondary | ICD-10-CM | POA: Diagnosis not present

## 2020-03-26 DIAGNOSIS — Z0000 Encounter for general adult medical examination without abnormal findings: Secondary | ICD-10-CM

## 2020-03-26 DIAGNOSIS — E785 Hyperlipidemia, unspecified: Secondary | ICD-10-CM | POA: Diagnosis not present

## 2020-03-26 DIAGNOSIS — N1831 Chronic kidney disease, stage 3a: Secondary | ICD-10-CM | POA: Diagnosis not present

## 2020-03-26 DIAGNOSIS — Z7189 Other specified counseling: Secondary | ICD-10-CM | POA: Diagnosis not present

## 2020-03-26 DIAGNOSIS — M8949 Other hypertrophic osteoarthropathy, multiple sites: Secondary | ICD-10-CM | POA: Diagnosis not present

## 2020-03-26 DIAGNOSIS — M159 Polyosteoarthritis, unspecified: Secondary | ICD-10-CM

## 2020-03-26 NOTE — Patient Instructions (Addendum)
You need to see a podiatrist to grind your toenails down--I recommend [facility].   https://www.mata.com/.pdf">  DASH Eating Plan DASH stands for Dietary Approaches to Stop Hypertension. The DASH eating plan is a healthy eating plan that has been shown to:  Reduce high blood pressure (hypertension).  Reduce your risk for type 2 diabetes, heart disease, and stroke.  Help with weight loss. What are tips for following this plan? Reading food labels  Check food labels for the amount of salt (sodium) per serving. Choose foods with less than 5 percent of the Daily Value of sodium. Generally, foods with less than 300 milligrams (mg) of sodium per serving fit into this eating plan.  To find whole grains, look for the word "whole" as the first word in the ingredient list. Shopping  Buy products labeled as "low-sodium" or "no salt added."  Buy fresh foods. Avoid canned foods and pre-made or frozen meals. Cooking  Avoid adding salt when cooking. Use salt-free seasonings or herbs instead of table salt or sea salt. Check with your health care provider or pharmacist before using salt substitutes.  Do not fry foods. Cook foods using healthy methods such as baking, boiling, grilling, roasting, and broiling instead.  Cook with heart-healthy oils, such as olive, canola, avocado, soybean, or sunflower oil. Meal planning  Eat a balanced diet that includes: ? 4 or more servings of fruits and 4 or more servings of vegetables each day. Try to fill one-half of your plate with fruits and vegetables. ? 6-8 servings of whole grains each day. ? Less than 6 oz (170 g) of lean meat, poultry, or fish each day. A 3-oz (85-g) serving of meat is about the same size as a deck of cards. One egg equals 1 oz (28 g). ? 2-3 servings of low-fat dairy each day. One serving is 1 cup (237 mL). ? 1 serving of nuts, seeds, or beans 5 times each week. ? 2-3 servings of heart-healthy fats.  Healthy fats called omega-3 fatty acids are found in foods such as walnuts, flaxseeds, fortified milks, and eggs. These fats are also found in cold-water fish, such as sardines, salmon, and mackerel.  Limit how much you eat of: ? Canned or prepackaged foods. ? Food that is high in trans fat, such as some fried foods. ? Food that is high in saturated fat, such as fatty meat. ? Desserts and other sweets, sugary drinks, and other foods with added sugar. ? Full-fat dairy products.  Do not salt foods before eating.  Do not eat more than 4 egg yolks a week.  Try to eat at least 2 vegetarian meals a week.  Eat more home-cooked food and less restaurant, buffet, and fast food.   Lifestyle  When eating at a restaurant, ask that your food be prepared with less salt or no salt, if possible.  If you drink alcohol: ? Limit how much you use to:  0-1 drink a day for women who are not pregnant.  0-2 drinks a day for men. ? Be aware of how much alcohol is in your drink. In the [place] one drink equals one 12 oz bottle of beer (355 mL), one 5 oz glass of wine (148 mL), or one 1 oz glass of hard liquor (44 mL). General information  Avoid eating more than 2,300 mg of salt a day. If you have hypertension, you may need to reduce your sodium intake to 1,500 mg a day.  Work with your health care provider to maintain  a healthy body weight or to lose weight. Ask what an ideal weight is for you.  Get at least 30 minutes of exercise that causes your heart to beat faster (aerobic exercise) most days of the week. Activities may include walking, swimming, or biking.  Work with your health care provider or dietitian to adjust your eating plan to your individual calorie needs. What foods should I eat? Fruits All fresh, dried, or frozen fruit. Canned fruit in natural juice (without added sugar). Vegetables Fresh or frozen vegetables (raw, steamed, roasted, or grilled). Low-sodium or reduced-sodium tomato and  vegetable juice. Low-sodium or reduced-sodium tomato sauce and tomato paste. Low-sodium or reduced-sodium canned vegetables. Grains Whole-grain or whole-wheat bread. Whole-grain or whole-wheat pasta. Brown rice. [place]. Bulgur. Whole-grain and low-sodium cereals. Pita bread. Low-fat, low-sodium crackers. Whole-wheat flour tortillas. Meats and other proteins Skinless chicken or [place]. Ground chicken or [place]. Pork with fat trimmed off. Fish and seafood. Egg whites. Dried beans, peas, or lentils. Unsalted nuts, nut butters, and seeds. Unsalted canned beans. Lean cuts of beef with fat trimmed off. Low-sodium, lean precooked or cured meat, such as sausages or meat loaves. Dairy Low-fat (1%) or fat-free (skim) milk. Reduced-fat, low-fat, or fat-free cheeses. Nonfat, low-sodium ricotta or cottage cheese. Low-fat or nonfat yogurt. Low-fat, low-sodium cheese. Fats and oils Soft margarine without trans fats. Vegetable oil. Reduced-fat, low-fat, or light mayonnaise and salad dressings (reduced-sodium). Canola, safflower, olive, avocado, soybean, and sunflower oils. Avocado. Seasonings and condiments Herbs. Spices. Seasoning mixes without salt. Other foods Unsalted popcorn and pretzels. Fat-free sweets. The items listed above may not be a complete list of foods and beverages you can eat. Contact a dietitian for more information. What foods should I avoid? Fruits Canned fruit in a light or heavy syrup. Fried fruit. Fruit in cream or butter sauce. Vegetables Creamed or fried vegetables. Vegetables in a cheese sauce. Regular canned vegetables (not low-sodium or reduced-sodium). Regular canned tomato sauce and paste (not low-sodium or reduced-sodium). Regular tomato and vegetable juice (not low-sodium or reduced-sodium). Angie Fava. Olives. Grains Baked goods made with fat, such as croissants, muffins, or some breads. Dry pasta or rice meal packs. Meats and other proteins Fatty cuts of meat. Ribs.  Fried meat. Berniece Salines. Bologna, salami, and other precooked or cured meats, such as sausages or meat loaves. Fat from the back of a pig (fatback). Bratwurst. Salted nuts and seeds. Canned beans with added salt. Canned or smoked fish. Whole eggs or egg yolks. Chicken or [place] with skin. Dairy Whole or 2% milk, cream, and half-and-half. Whole or full-fat cream cheese. Whole-fat or sweetened yogurt. Full-fat cheese. Nondairy creamers. Whipped toppings. Processed cheese and cheese spreads. Fats and oils Butter. Stick margarine. Lard. Shortening. Ghee. Bacon fat. Tropical oils, such as coconut, palm kernel, or palm oil. Seasonings and condiments Onion salt, garlic salt, seasoned salt, table salt, and sea salt. Worcestershire sauce. Tartar sauce. Barbecue sauce. Teriyaki sauce. Soy sauce, including reduced-sodium. Steak sauce. Canned and packaged gravies. Fish sauce. Oyster sauce. Cocktail sauce. Store-bought horseradish. Ketchup. Mustard. Meat flavorings and tenderizers. Bouillon cubes. Hot sauces. Pre-made or packaged marinades. Pre-made or packaged taco seasonings. Relishes. Regular salad dressings. Other foods Salted popcorn and pretzels. The items listed above may not be a complete list of foods and beverages you should avoid. Contact a dietitian for more information. Where to find more information  National Heart, Lung, and Blood Institute: https://wilson-eaton.com/  American Heart Association: www.heart.org  Academy of Nutrition and Dietetics: www.eatright.[place]: www.kidney.org Summary  The DASH eating plan is a healthy eating plan that has been shown to reduce high blood pressure (hypertension). It may also reduce your risk for type 2 diabetes, heart disease, and stroke.  When on the DASH eating plan, aim to eat more fresh fruits and vegetables, whole grains, lean proteins, low-fat dairy, and heart-healthy fats.  With the DASH eating plan, you should limit salt (sodium)  intake to 2,300 mg a day. If you have hypertension, you may need to reduce your sodium intake to 1,500 mg a day.  Work with your health care provider or dietitian to adjust your eating plan to your individual calorie needs. This information is not intended to replace advice given to you by your health care provider. Make sure you discuss any questions you have with your health care provider. Document Revised: 11/26/2018 Document Reviewed: 11/26/2018 Elsevier Patient Education  2021 [employer].

## 2020-03-26 NOTE — Assessment & Plan Note (Signed)
On losartan  Will recheck labs 

## 2020-03-26 NOTE — Progress Notes (Signed)
Hearing Screening   Method: Audiometry   125Hz  250Hz  500Hz  1000Hz  2000Hz  3000Hz  4000Hz  6000Hz  8000Hz   Right ear:   20 20 20  20     Left ear:   20 20 20  20       Visual Acuity Screening   Right eye Left eye Both eyes  Without correction:     With correction: 20/50 20/50 20/40   Comments: Cataract Surgery both eyes

## 2020-03-26 NOTE — Assessment & Plan Note (Signed)
I have personally reviewed the Medicare Annual Wellness questionnaire and have noted 1. The patient's medical and social history 2. Their use of alcohol, tobacco or illicit drugs 3. Their current medications and supplements 4. The patient's functional ability including ADL's, fall risks, home safety risks and hearing or visual             impairment. 5. Diet and physical activities 6. Evidence for depression or mood disorders  The patients weight, height, BMI and visual acuity have been recorded in the chart I have made referrals, counseling and provided education to the patient based review of the above and I have provided the pt with a written personalized care plan for preventive services.  I have provided you with a copy of your personalized plan for preventive services. Please take the time to review along with your updated medication list.  Discussed and I recommended no more cancer screening Discussed exercise Consider shingrix at pharmacy COVID booster and flu vaccine --probably both in the fall

## 2020-03-26 NOTE — Assessment & Plan Note (Signed)
See social history 

## 2020-03-26 NOTE — Assessment & Plan Note (Signed)
Uses tylenol prn only 

## 2020-03-26 NOTE — Assessment & Plan Note (Signed)
No problems with primary prevention with statin 

## 2020-03-26 NOTE — Progress Notes (Signed)
Subjective:    Patient ID: [name], female    DOB: [birth year], 80 y.o.   MRN: [id number]  HPI Here for Medicare wellness visit and follow up of chronic health conditions This visit occurred during the SARS-CoV-2 public health emergency.  Safety protocols were in place, including screening questions prior to the visit, additional usage of staff PPE, and extensive cleaning of exam room while observing appropriate contact time as indicated for disinfecting solutions.   Reviewed form and advanced directives Reviewed other doctors No alcohol or tobacco Tries to walk some--not much Sister lives with her now--has chronic oxygen Son takes her shopping. She does the instrumental ADLs Vision is not great--but okay Hearing still not great---not bad enough for hearing aide Larey Seat once---no injury No depression or anhedonia Memory seems to be okay  She is concerned about getting a pedicure Has thick mycotic toenails Discussed podiatrist  Still on losartan and metoprolol for BP No chest pain or SOB No dizziness or syncope Last GFR 47 No edema  Still on statin No problems with that  Some joint pains---not too bad Uses tylenol prn  Current Outpatient Medications on File Prior to Visit  Medication Sig Dispense Refill  . acetaminophen (TYLENOL) 325 MG tablet Take 650 mg by mouth 3 (three) times daily as needed for mild pain.    . cholecalciferol (VITAMIN D) 1000 UNITS tablet Take 1,000 Units by mouth daily.    Marland Kitchen losartan (COZAAR) 50 MG tablet TAKE 1 TABLET BY MOUTH  DAILY 90 tablet 3  . metoprolol tartrate (LOPRESSOR) 50 MG tablet TAKE 1 TABLET BY MOUTH  TWICE DAILY 180 tablet 3  . simvastatin (ZOCOR) 20 MG tablet TAKE 1 TABLET BY MOUTH  DAILY 90 tablet 3   No current facility-administered medications on file prior to visit.    No Known Allergies  Past Medical History:  Diagnosis Date  . Hyperlipidemia   . Hypertension   . Osteoarthritis, multiple sites     Past  Surgical History:  Procedure Laterality Date  . APPENDECTOMY    . CATARACT EXTRACTION W/ INTRAOCULAR LENS  IMPLANT, BILATERAL Bilateral 2012  . KNEE ARTHROSCOPY Left 2017  . TONSILLECTOMY      Family History  Problem Relation Age of Onset  . Stroke Father   . Hypertension Sister   . Heart disease Sister        heart valve replaced  . Diabetes Brother   . Hypertension Brother   . Diabetes Brother   . Hypertension Brother   . Hypertension Sister   . Hypertension Sister   . Hypertension Sister   . Cancer Maternal Grandmother        breast cancer    Social History   Socioeconomic History  . Marital status: Divorced    Spouse name: Not on file  . Number of children: 1  . Years of education: Not on file  . Highest education level: Not on file  Occupational History  . Occupation: Engineering geologist    Comment: Retired from [employer]    Employer: RETIRED  Tobacco Use  . Smoking status: Never Smoker  . Smokeless tobacco: Never Used  Substance and Sexual Activity  . Alcohol use: No    Alcohol/week: 0.0 standard drinks  . Drug use: No  . Sexual activity: Not on file  Other Topics Concern  . Not on file  Social History Narrative   Widowed twice then divorced   One son      No living will  Would have son be health care POA if needed   Not sure about DNR-- very adamant about not being kept on life support   No feeding tube!!   Social Determinants of Health   Financial Resource Strain: Not on file  Food Insecurity: Not on file  Transportation Needs: Not on file  Physical Activity: Not on file  Stress: Not on file  Social Connections: Not on file  Intimate Partner Violence: Not on file   Review of Systems Appetite is good Weight is fairly stable Sleeps okay--does wake up 3-4 hours after initiating, but able to get back to sleep Wears seat belt Has full dentures Some red areas on skin---no ulcers No heartburn or dysphagia Bowels are slow--but okay. Using stool softener. No  blood Voids okay. Some urgency---uses pad just in case     Objective:   Physical Exam Constitutional:      Appearance: Normal appearance.  HENT:     Mouth/Throat:     Pharynx: No oropharyngeal exudate or posterior oropharyngeal erythema.     Comments: Full dentures Eyes:     Conjunctiva/sclera: Conjunctivae normal.     Pupils: Pupils are equal, round, and reactive to light.  Cardiovascular:     Rate and Rhythm: Normal rate and regular rhythm.     Pulses: Normal pulses.     Heart sounds: No murmur heard.   Pulmonary:     Effort: Pulmonary effort is normal.     Breath sounds: Normal breath sounds. No wheezing or rales.  Abdominal:     Palpations: Abdomen is soft.     Tenderness: There is no abdominal tenderness.  Musculoskeletal:     Cervical back: Neck supple.     Right lower leg: No edema.     Left lower leg: No edema.  Lymphadenopathy:     Cervical: No cervical adenopathy.  Skin:    General: Skin is warm.     Findings: No rash.  Neurological:     Mental Status: She is alert and oriented to person, place, and time.     Comments: President--- "Biden, Trump, ?" [phone] D-l-r-o-w Recall 3/3  Psychiatric:        Mood and Affect: Mood normal.        Behavior: Behavior normal.            Assessment & Plan:

## 2020-03-26 NOTE — Assessment & Plan Note (Signed)
BP Readings from Last 3 Encounters:  03/26/20 140/84  03/14/19 138/84  03/04/18 134/84   Good control on losartan and metoprolol Will check labs

## 2020-03-27 LAB — HEPATIC FUNCTION PANEL
ALT: 22 U/L (ref 0–35)
AST: 24 U/L (ref 0–37)
Albumin: 4.4 g/dL (ref 3.5–5.2)
Alkaline Phosphatase: 66 U/L (ref 39–117)
Bilirubin, Direct: 0.1 mg/dL (ref 0.0–0.3)
Total Bilirubin: 0.5 mg/dL (ref 0.2–1.2)
Total Protein: 7.2 g/dL (ref 6.0–8.3)

## 2020-03-27 LAB — LIPID PANEL
Cholesterol: 160 mg/dL (ref 0–200)
HDL: 50.3 mg/dL (ref >39.00)
LDL Cholesterol: 74 mg/dL (ref 0–99)
NonHDL: 109.72
Total CHOL/HDL Ratio: 3
Triglycerides: 178 mg/dL — ABNORMAL HIGH (ref 0.0–149.0)
VLDL: 35.6 mg/dL (ref 0.0–40.0)

## 2020-03-27 LAB — CBC
HCT: 42.7 % (ref 36.0–46.0)
Hemoglobin: 14.5 g/dL (ref 12.0–15.0)
MCHC: 34 g/dL (ref 30.0–36.0)
MCV: 90.5 fl (ref 78.0–100.0)
Platelets: 174 10*3/uL (ref 150.0–400.0)
RBC: 4.72 Mil/uL (ref 3.87–5.11)
RDW: 12.6 % (ref 11.5–15.5)
WBC: 5.8 10*3/uL (ref 4.0–10.5)

## 2020-03-27 LAB — RENAL FUNCTION PANEL
Albumin: 4.4 g/dL (ref 3.5–5.2)
BUN: 20 mg/dL (ref 6–23)
CO2: 28 mEq/L (ref 19–32)
Calcium: 9.4 mg/dL (ref 8.4–10.5)
Chloride: 101 mEq/L (ref 96–112)
Creatinine, Ser: 1.49 mg/dL — ABNORMAL HIGH (ref 0.40–1.20)
GFR: 32.76 mL/min — ABNORMAL LOW (ref >60.00)
Glucose, Bld: 98 mg/dL (ref 70–99)
Phosphorus: 3.7 mg/dL (ref 2.3–4.6)
Potassium: 4.8 mEq/L (ref 3.5–5.1)
Sodium: 136 mEq/L (ref 135–145)

## 2020-03-28 ENCOUNTER — Other Ambulatory Visit: Payer: Self-pay | Admitting: Internal Medicine

## 2020-03-28 DIAGNOSIS — N1832 Chronic kidney disease, stage 3b: Secondary | ICD-10-CM

## 2020-03-28 NOTE — Progress Notes (Signed)
nephro

## 2020-06-13 DIAGNOSIS — N390 Urinary tract infection, site not specified: Secondary | ICD-10-CM | POA: Diagnosis not present

## 2020-06-13 DIAGNOSIS — D631 Anemia in chronic kidney disease: Secondary | ICD-10-CM | POA: Diagnosis not present

## 2020-06-13 DIAGNOSIS — E785 Hyperlipidemia, unspecified: Secondary | ICD-10-CM | POA: Diagnosis not present

## 2020-06-13 DIAGNOSIS — N1832 Chronic kidney disease, stage 3b: Secondary | ICD-10-CM | POA: Diagnosis not present

## 2020-06-13 DIAGNOSIS — I129 Hypertensive chronic kidney disease with stage 1 through stage 4 chronic kidney disease, or unspecified chronic kidney disease: Secondary | ICD-10-CM | POA: Diagnosis not present

## 2020-06-13 DIAGNOSIS — N2581 Secondary hyperparathyroidism of renal origin: Secondary | ICD-10-CM | POA: Diagnosis not present

## 2020-06-14 ENCOUNTER — Other Ambulatory Visit: Payer: Self-pay | Admitting: Nephrology

## 2020-06-14 DIAGNOSIS — N1832 Chronic kidney disease, stage 3b: Secondary | ICD-10-CM

## 2020-06-20 ENCOUNTER — Ambulatory Visit
Admission: RE | Admit: 2020-06-20 | Discharge: 2020-06-20 | Disposition: A | Discharge status: Home / Self Care | Payer: Medicare Other | Source: Ambulatory Visit | Attending: Nephrology | Admitting: Nephrology

## 2020-06-20 DIAGNOSIS — N1832 Chronic kidney disease, stage 3b: Secondary | ICD-10-CM

## 2020-07-22 ENCOUNTER — Other Ambulatory Visit: Payer: Self-pay | Admitting: Internal Medicine

## 2020-09-01 ENCOUNTER — Emergency Department (HOSPITAL_COMMUNITY): Payer: Medicare Other

## 2020-09-01 ENCOUNTER — Emergency Department (HOSPITAL_COMMUNITY)
Admission: EM | Admit: 2020-09-01 | Discharge: 2020-09-01 | Disposition: A | Discharge status: Home / Self Care | Payer: Medicare Other | Attending: Emergency Medicine | Admitting: Emergency Medicine

## 2020-09-01 ENCOUNTER — Encounter (HOSPITAL_COMMUNITY): Payer: Self-pay

## 2020-09-01 ENCOUNTER — Other Ambulatory Visit: Payer: Self-pay

## 2020-09-01 DIAGNOSIS — M25551 Pain in right hip: Secondary | ICD-10-CM | POA: Insufficient documentation

## 2020-09-01 DIAGNOSIS — M545 Low back pain, unspecified: Secondary | ICD-10-CM | POA: Insufficient documentation

## 2020-09-01 DIAGNOSIS — I129 Hypertensive chronic kidney disease with stage 1 through stage 4 chronic kidney disease, or unspecified chronic kidney disease: Secondary | ICD-10-CM | POA: Diagnosis not present

## 2020-09-01 DIAGNOSIS — Z79899 Other long term (current) drug therapy: Secondary | ICD-10-CM | POA: Insufficient documentation

## 2020-09-01 DIAGNOSIS — N1831 Chronic kidney disease, stage 3a: Secondary | ICD-10-CM | POA: Insufficient documentation

## 2020-09-01 LAB — COMPREHENSIVE METABOLIC PANEL
ALT: 25 U/L (ref 0–44)
AST: 25 U/L (ref 15–41)
Albumin: 4 g/dL (ref 3.5–5.0)
Alkaline Phosphatase: 60 U/L (ref 38–126)
Anion gap: 7 (ref 5–15)
BUN: 10 mg/dL (ref 8–23)
CO2: 25 mmol/L (ref 22–32)
Calcium: 9.6 mg/dL (ref 8.9–10.3)
Chloride: 105 mmol/L (ref 98–111)
Creatinine, Ser: 1.06 mg/dL — ABNORMAL HIGH (ref 0.44–1.00)
GFR, Estimated: 53 mL/min — ABNORMAL LOW (ref >60)
Glucose, Bld: 106 mg/dL — ABNORMAL HIGH (ref 70–99)
Potassium: 4.5 mmol/L (ref 3.5–5.1)
Sodium: 137 mmol/L (ref 135–145)
Total Bilirubin: 0.8 mg/dL (ref 0.3–1.2)
Total Protein: 7.2 g/dL (ref 6.5–8.1)

## 2020-09-01 LAB — URINALYSIS, ROUTINE W REFLEX MICROSCOPIC
Bacteria, UA: NONE SEEN
Bilirubin Urine: NEGATIVE
Glucose, UA: NEGATIVE mg/dL
Ketones, ur: NEGATIVE mg/dL
Leukocytes,Ua: NEGATIVE
Nitrite: NEGATIVE
Protein, ur: NEGATIVE mg/dL
Specific Gravity, Urine: 1.013 (ref 1.005–1.030)
pH: 6 (ref 5.0–8.0)

## 2020-09-01 LAB — LIPASE, BLOOD: Lipase: 35 U/L (ref 11–51)

## 2020-09-01 LAB — CBC WITH DIFFERENTIAL/PLATELET
Abs Immature Granulocytes: 0.01 10*3/uL (ref 0.00–0.07)
Basophils Absolute: 0 10*3/uL (ref 0.0–0.1)
Basophils Relative: 1 %
Eosinophils Absolute: 0.2 10*3/uL (ref 0.0–0.5)
Eosinophils Relative: 3 %
HCT: 44.7 % (ref 36.0–46.0)
Hemoglobin: 14.7 g/dL (ref 12.0–15.0)
Immature Granulocytes: 0 %
Lymphocytes Relative: 24 %
Lymphs Abs: 1.4 10*3/uL (ref 0.7–4.0)
MCH: 30.4 pg (ref 26.0–34.0)
MCHC: 32.9 g/dL (ref 30.0–36.0)
MCV: 92.5 fL (ref 80.0–100.0)
Monocytes Absolute: 0.6 10*3/uL (ref 0.1–1.0)
Monocytes Relative: 9 %
Neutro Abs: 3.8 10*3/uL (ref 1.7–7.7)
Neutrophils Relative %: 63 %
Platelets: 176 10*3/uL (ref 150–400)
RBC: 4.83 MIL/uL (ref 3.87–5.11)
RDW: 12.5 % (ref 11.5–15.5)
WBC: 6 10*3/uL (ref 4.0–10.5)
nRBC: 0 % (ref 0.0–0.2)

## 2020-09-01 NOTE — Discharge Instructions (Addendum)
Please follow-up with your PCP.  Please take Tylenol and ibuprofen as needed for pain. 

## 2020-09-01 NOTE — ED Notes (Signed)
Patient transported to X-ray 

## 2020-09-01 NOTE — ED Provider Notes (Signed)
I saw and evaluated the patient, reviewed the resident's note and I agree with the findings and plan.      80 year old female who presents with right hip and low back pain.  Atraumatic in nature.  Worse with standing.  No urinary symptoms.  No foot drop.  We will x-ray patient's right hip and likely discharge home   Lorre Nick, MD 09/01/20 [phone]

## 2020-09-01 NOTE — ED Triage Notes (Signed)
Patient complains of right flank / back pain x 1 day. Denies injury. Denis urinary symptoms.

## 2020-09-01 NOTE — ED Notes (Signed)
Pt reports pain in lower back that radiates into right leg.  Pt states pain worse with ambulation and with lifting of leg.

## 2020-09-01 NOTE — ED Provider Notes (Signed)
Emergency Medicine Provider Triage Evaluation Note  Kathryn Lopez , a 80 y.o. female  was evaluated in triage.  Pt complains of low back pain yesterday has been persistent since.  She states that it began after she was getting out of her recliner but she has had persistent pain since.  She denies any falls or injuries.  She denies any urinary frequency urgency dysuria.  Denies any history of kidney stones.  Denies any vaginal discharge.  Denies any chest pain or shortness of breath.  Seem to have some mild discomfort in her abdomen earlier this morning but denies any currently.  Review of Systems  Positive: Back pain Negative: Fever  Physical Exam  BP (!) 188/73 (BP Location: Left Arm)   Pulse (!) 51   Temp 97.9 F (36.6 C)   Resp 16   SpO2 99%  Gen:   Awake, no distress   Resp:  Normal effort  MSK:   Moves extremities without difficulty  Other:  Well-appearing 80 year old female.  No CVA tenderness.  No abdominal tenderness to palpation.  Midline C, T, L-spine without significant tenderness to palpation.  Medical Decision Making  Medically screening exam initiated at 10:24 AM.  Appropriate orders placed.  Kathryn Lopez was informed that the remainder of the evaluation will be completed by another provider, this initial triage assessment does not replace that evaluation, and the importance of remaining in the ED until their evaluation is complete.  Low back pain an 80 year old female this is atraumatic. Will obtain screening x-ray.  Will defer to provider disease patient in major care whether to do additional imaging.  Will obtain urine and abdominal labs given with her age and her symptoms.   Kathryn Lopez, [place] 09/01/20 1030    Kathryn Nick, MD 09/04/20 [phone]

## 2020-09-01 NOTE — ED Provider Notes (Signed)
[facility] EMERGENCY DEPARTMENT Provider Note   CSN: [id number] Arrival date & time: 09/01/20  3149     History No chief complaint on file.   Kathryn Lopez is a 80 y.o. female.  HPI  Patient is an 80 year old female who is presenting for right hip and lower back pain.  She states that the pain has been going on since yesterday.  She states that the pain has been persistent.  She complains that the pain is about a 4 out of 10.  She denies any recent falls or trauma.  She states that she felt the pain as she was getting out of her recliner.  She denies any pain with palpation.  She denies any recent fevers, chills, urinary incontinence, bowel incontinence, saddle anesthesia.  She denies any hematuria, dysuria change in urinary frequency or history of kidney stones.  Patient is able ambulate without complications.  Patient denies any fevers, chills, headache, neck stiffness, chest pain, shortness breath, abdominal pain, nausea, vomiting, diarrhea, numbness or weakness.    Past Medical History:  Diagnosis Date   Hyperlipidemia    Hypertension    Osteoarthritis, multiple sites     Patient Active Problem List   Diagnosis Date Noted   Stage 3a chronic kidney disease (HCC) 03/14/2019   Advance directive discussed with patient 12/07/2014   Preventative health care 12/05/2013   Hyperlipidemia    Hypertension    Osteoarthritis, multiple sites     Past Surgical History:  Procedure Laterality Date   APPENDECTOMY     CATARACT EXTRACTION W/ INTRAOCULAR LENS  IMPLANT, BILATERAL Bilateral 2012   KNEE ARTHROSCOPY Left 2017   TONSILLECTOMY       OB History   No obstetric history on file.     Family History  Problem Relation Age of Onset   Stroke Father    Hypertension Sister    Heart disease Sister        heart valve replaced   Diabetes Brother    Hypertension Brother    Diabetes Brother    Hypertension Brother    Hypertension Sister    Hypertension  Sister    Hypertension Sister    Cancer Maternal Grandmother        breast cancer    Social History   Tobacco Use   Smoking status: Never   Smokeless tobacco: Never  Substance Use Topics   Alcohol use: No    Alcohol/week: 0.0 standard drinks   Drug use: No    Home Medications Prior to Admission medications   Medication Sig Start Date End Date Taking? Authorizing Provider  acetaminophen (TYLENOL) 325 MG tablet Take 650 mg by mouth 3 (three) times daily as needed for mild pain.    [provider]  cholecalciferol (VITAMIN D) 1000 UNITS tablet Take 1,000 Units by mouth daily.    [provider]  losartan (COZAAR) 50 MG tablet TAKE 1 TABLET BY MOUTH  DAILY 07/23/20   Tillman Abide I, MD  metoprolol tartrate (LOPRESSOR) 50 MG tablet TAKE 1 TABLET BY MOUTH  TWICE DAILY 07/23/20   Tillman Abide I, MD  simvastatin (ZOCOR) 20 MG tablet TAKE 1 TABLET BY MOUTH  DAILY 07/23/20   Karie Schwalbe, MD    Allergies    Patient has no known allergies.  Review of Systems   Review of Systems  Constitutional:  Negative for chills, diaphoresis, fatigue and fever.  HENT:  Negative for congestion, dental problem, ear discharge, ear pain, facial swelling,  hearing loss, nosebleeds, postnasal drip, rhinorrhea, sinus pain, sneezing, sore throat and trouble swallowing.   Eyes:  Negative for pain and visual disturbance.  Respiratory:  Negative for cough, chest tightness, shortness of breath, wheezing and stridor.   Cardiovascular:  Negative for chest pain, palpitations and leg swelling.  Gastrointestinal:  Negative for abdominal distention, abdominal pain, blood in stool, constipation, diarrhea, nausea and vomiting.  Endocrine: Negative for polydipsia and polyuria.  Genitourinary:  Negative for difficulty urinating, dysuria, flank pain, frequency, hematuria, urgency, vaginal bleeding and vaginal discharge.  Musculoskeletal:  Positive for back pain. Negative for myalgias, neck pain and  neck stiffness.       Right hip and back pain  Skin:  Negative for rash and wound.  Allergic/Immunologic: Negative for environmental allergies and food allergies.  Neurological:  Negative for dizziness, seizures, syncope, facial asymmetry, speech difficulty, weakness, light-headedness, numbness and headaches.  Psychiatric/Behavioral:  Negative for agitation, behavioral problems and confusion.    Physical Exam Updated Vital Signs BP 131/62   Pulse (!) 54   Temp 97.9 F (36.6 C)   Resp 15   SpO2 96%   Physical Exam Vitals and nursing note reviewed.  Constitutional:      General: She is not in acute distress.    Appearance: Normal appearance. She is normal weight. She is not ill-appearing.  HENT:     Head: Normocephalic and atraumatic.     Right Ear: External ear normal.     Left Ear: External ear normal.     Nose: Nose normal. No congestion.     Mouth/Throat:     Mouth: Mucous membranes are moist.     Pharynx: Oropharynx is clear. No oropharyngeal exudate or posterior oropharyngeal erythema.  Eyes:     General: No visual field deficit.    Extraocular Movements: Extraocular movements intact.     Conjunctiva/sclera: Conjunctivae normal.     Pupils: Pupils are equal, round, and reactive to light.  Neck:     Vascular: No carotid bruit.  Cardiovascular:     Rate and Rhythm: Normal rate and regular rhythm.     Pulses: Normal pulses.     Heart sounds: Normal heart sounds. No murmur heard. Pulmonary:     Effort: Pulmonary effort is normal. No respiratory distress.     Breath sounds: Normal breath sounds. No stridor. No wheezing, rhonchi or rales.  Chest:     Chest wall: No tenderness.  Abdominal:     General: Bowel sounds are normal. There is no distension.     Palpations: Abdomen is soft.     Tenderness: There is no abdominal tenderness. There is no right CVA tenderness, left CVA tenderness, guarding or rebound.  Musculoskeletal:        General: No swelling. Normal range of  motion.     Cervical back: Normal range of motion and neck supple. No rigidity, tenderness or bony tenderness.     Thoracic back: Normal. No tenderness or bony tenderness.     Lumbar back: Normal. No tenderness or bony tenderness.     Right hip: Tenderness and bony tenderness present.     Right lower leg: No edema.     Left lower leg: No edema.  Skin:    General: Skin is warm and dry.     Coloration: Skin is not jaundiced.  Neurological:     General: No focal deficit present.     Mental Status: She is alert and oriented to person, place, and time. Mental status  is at baseline.     Cranial Nerves: Cranial nerves are intact. No cranial nerve deficit, dysarthria or facial asymmetry.     Sensory: Sensation is intact. No sensory deficit.     Motor: Motor function is intact. No weakness.     Coordination: Coordination is intact. Finger-Nose-Finger Test normal.     Gait: Gait is intact. Gait normal.  Psychiatric:        Mood and Affect: Mood normal.        Behavior: Behavior normal.        Thought Content: Thought content normal.        Judgment: Judgment normal.    ED Results / Procedures / Treatments   Labs (all labs ordered are listed, but only abnormal results are displayed) Labs Reviewed  COMPREHENSIVE METABOLIC PANEL - Abnormal; Notable for the following components:      Result Value   Glucose, Bld 106 (*)    Creatinine, Ser 1.06 (*)    GFR, Estimated 53 (*)    All other components within normal limits  URINALYSIS, ROUTINE W REFLEX MICROSCOPIC - Abnormal; Notable for the following components:   Hgb urine dipstick SMALL (*)    All other components within normal limits  CBC WITH DIFFERENTIAL/PLATELET  LIPASE, BLOOD    EKG None  Radiology DG Lumbar Spine Complete  Result Date: 09/01/2020 CLINICAL DATA:  Atraumatic back pain. EXAM: LUMBAR SPINE - COMPLETE 4+ VIEW COMPARISON:  None FINDINGS: Grade 1 anterolisthesis of L1 versus L2. No other malalignment. No fracture.  Multilevel degenerative disc disease. Lumbar facet degenerative changes. No other acute abnormalities. IMPRESSION: 1. Degenerative changes as above.  No acute abnormalities. Electronically Signed   By: Gerome Sam III M.D.   On: 09/01/2020 11:13   DG Hip Unilat W or Wo Pelvis 2-3 Views Right  Result Date: 09/01/2020 CLINICAL DATA:  Right hip pain of unknown origin. EXAM: DG HIP (WITH OR WITHOUT PELVIS) 2-3V RIGHT COMPARISON:  None. FINDINGS: There is no evidence of hip fracture or dislocation. There is no evidence of arthropathy or other focal bone abnormality. IMPRESSION: Negative. Electronically Signed   By: Gerome Sam III M.D.   On: 09/01/2020 13:52    Procedures Procedures   Medications Ordered in ED Medications - No data to display  ED Course  I have reviewed the triage vital signs and the nursing notes.  Pertinent labs & imaging results that were available during my care of the patient were reviewed by me and considered in my medical decision making (see chart for details).    MDM Rules/Calculators/A&P                         MONEY MCKEITHAN is a 80 y.o. female who is presenting for right hip and lower back pain.  Patient is hemodynamically stable and in no acute distress.  Patient denies any recent falls or trauma.  She states that the pain occure while she was getting out of her recliner yesterday.  She denies any fever, urinary incontinence, bowel incontinence, saddle anesthesia.  She denies any foot drop. On physical exam patient has no pain with palpation of her spine.  She denies any tenderness with palpation of right lower back.  She is able to ambulate without complications.  Her neurological exam is notable for no focal neurodeficits.  She denies any weakness or changes in sensation.  She denies any hematuria, urinary incontinence, dysuria.  She denies any history of kidney  stones.  Patient CBC was unremarkable.  Patient CMP was notable for a GFR 53.  She is being  followed by her PCP for her kidneys.  Patient's right hip x-ray showed no fracture.  Lumbar x-ray showed no fracture.  Patient was able to ambulate in the ED with no complications.  She states that she is feeling better. Patient's going to follow-up with her PCP for further evaluation.  Patient states compliance and understanding of the plan. I explained labs and imaging to the patient. No further questions at this time from the patient.  The patient is safe and stable for discharge at this time with return precautions provided and a plan for follow-up care in place as needed  The plan for this patient was discussed with Dr. Freida Busman, who voiced agreement and who oversaw evaluation and treatment of this patient.   Final Clinical Impression(s) / ED Diagnoses Final diagnoses:  Right hip pain    Rx / DC Orders ED Discharge Orders     None        Lottie Dawson, MD 09/01/20 1727    Lorre Nick, MD 09/04/20 [phone]

## 2020-09-06 ENCOUNTER — Encounter: Payer: Self-pay | Admitting: Internal Medicine

## 2020-09-06 ENCOUNTER — Ambulatory Visit (INDEPENDENT_AMBULATORY_CARE_PROVIDER_SITE_OTHER): Payer: Medicare Other | Admitting: Internal Medicine

## 2020-09-06 ENCOUNTER — Other Ambulatory Visit: Payer: Self-pay

## 2020-09-06 DIAGNOSIS — M7918 Myalgia, other site: Secondary | ICD-10-CM | POA: Insufficient documentation

## 2020-09-06 NOTE — Patient Instructions (Signed)
If you have pain again---try heat after the first day (ice initially may help more) and regular tylenol---650mg  every 4-6 hours. You might want to take tylenol before you are mopping or vacuuming. You can also try 1-2 days of over the counter diclofenac gel if you hurt it again.

## 2020-09-06 NOTE — Assessment & Plan Note (Signed)
Clearly has pain in paraspinals No radicular component Better now  Discussed tylenol, heat, consider diclofenac gel

## 2020-09-06 NOTE — Progress Notes (Signed)
Subjective:    Patient ID: Kathryn Lopez, female    DOB: 1940-01-22, 80 y.o.   MRN: [id number]  HPI Here for ER follow up This visit occurred during the SARS-CoV-2 public health emergency.  Safety protocols were in place, including screening questions prior to the visit, additional usage of staff PPE, and extensive cleaning of exam room while observing appropriate contact time as indicated for disinfecting solutions.   Having a lot of pain in her lower back ---worst getting out of bed or chair Went to ER next day--8/27 X-ray of hip negative. Lumbar spine showed degenerative changes. No worrisome findings so they sent her home Would be pulled forward/leaning forward Unable to move fast--so had urinary urge incontinence  No clear inciting factors Still vacuums and mops kitchen floor This does hurt--"but it has to be done"  Some improvement in the past few days--and much better today (just achy) Does improve when she is walking around for a while Points to right lumbar paraspinals Did try some tylenol--- 650mg  that first night (may have helped) and several doses since then  Current Outpatient Medications on File Prior to Visit  Medication Sig Dispense Refill   acetaminophen (TYLENOL) 325 MG tablet Take 650 mg by mouth 3 (three) times daily as needed for mild pain.     cholecalciferol (VITAMIN D) 1000 UNITS tablet Take 1,000 Units by mouth daily.     losartan (COZAAR) 50 MG tablet TAKE 1 TABLET BY MOUTH  DAILY 90 tablet 3   metoprolol tartrate (LOPRESSOR) 50 MG tablet TAKE 1 TABLET BY MOUTH  TWICE DAILY 180 tablet 3   simvastatin (ZOCOR) 20 MG tablet TAKE 1 TABLET BY MOUTH  DAILY 90 tablet 3   No current facility-administered medications on file prior to visit.    No Known Allergies  Past Medical History:  Diagnosis Date   Hyperlipidemia    Hypertension    Osteoarthritis, multiple sites     Past Surgical History:  Procedure Laterality Date   APPENDECTOMY     CATARACT  EXTRACTION W/ INTRAOCULAR LENS  IMPLANT, BILATERAL Bilateral 2012   KNEE ARTHROSCOPY Left 2017   TONSILLECTOMY      Family History  Problem Relation Age of Onset   Stroke Father    Hypertension Sister    Heart disease Sister        heart valve replaced   Diabetes Brother    Hypertension Brother    Diabetes Brother    Hypertension Brother    Hypertension Sister    Hypertension Sister    Hypertension Sister    Cancer Maternal Grandmother        breast cancer    Social History   Socioeconomic History   Marital status: Divorced    Spouse name: Not on file   Number of children: 1   Years of education: Not on file   Highest education level: Not on file  Occupational History   Occupation: Retail    Comment: Retired from 2018    Employer: RETIRED  Tobacco Use   Smoking status: Never   Smokeless tobacco: Never  Substance and Sexual Activity   Alcohol use: No    Alcohol/week: 0.0 standard drinks   Drug use: No   Sexual activity: Not on file  Other Topics Concern   Not on file  Social History Narrative   Widowed twice then divorced   One son      No living will   Would have son be health care  POA if needed   Not sure about DNR-- very adamant about not being kept on life support   No feeding tube!!   Social Determinants of Health   Financial Resource Strain: Not on file  Food Insecurity: Not on file  Transportation Needs: Not on file  Physical Activity: Not on file  Stress: Not on file  Social Connections: Not on file  Intimate Partner Violence: Not on file   Review of Systems No leg weakness---but they feel tight with hip flexion Incontinence is improved No N/V Eating okay     Objective:   Physical Exam Constitutional:      Appearance: Normal appearance.  Musculoskeletal:     Comments: No spine tenderness Pain spot is lumbar paraspinal region SLR negative bilaterally Reduced internal rotation with upper right thigh pain with that  movement---different from this pain Left hip rotation is normal  Neurological:     Mental Status: She is alert.     Comments: Gait is normal No leg weakness           Assessment & Plan:

## 2020-10-16 ENCOUNTER — Other Ambulatory Visit: Payer: Self-pay

## 2020-10-16 ENCOUNTER — Telehealth: Payer: Self-pay

## 2020-10-16 ENCOUNTER — Ambulatory Visit (HOSPITAL_COMMUNITY)
Admission: EM | Admit: 2020-10-16 | Discharge: 2020-10-16 | Disposition: A | Discharge status: Home / Self Care | Payer: Medicare Other | Attending: Student | Admitting: Student

## 2020-10-16 ENCOUNTER — Encounter (HOSPITAL_COMMUNITY): Payer: Self-pay | Admitting: Emergency Medicine

## 2020-10-16 DIAGNOSIS — L237 Allergic contact dermatitis due to plants, except food: Secondary | ICD-10-CM

## 2020-10-16 MED ORDER — PREDNISONE 20 MG PO TABS: 40.0000 mg | ORAL_TABLET | Freq: Every day | ORAL | 0 refills | Status: AC

## 2020-10-16 NOTE — Telephone Encounter (Signed)
Agree with need for evaluation.  

## 2020-10-16 NOTE — Telephone Encounter (Signed)
Pt has red rash above both eyebrows; rash on hands that have small blisters. Rash on waist and on upper thighs and spot on knee that has blisters. Pt has burning sensation like a raw spot where pt has rash. No pain at this time.Pt has been picking up limbs in yard; ? Poison oak or ivy. Pt has had shingles before and thinks this is shingles. No available appts at [facility] and pt will have her son drive her to either [facility] or an UC on [address]. UC & ED precautions given and pt voiced  understanding. Sending note to Dr Alphonsus Sias who is out of office and Dr Selena Batten who is in office and [facility] CMA.

## 2020-10-16 NOTE — ED Triage Notes (Signed)
Pt c/o rash on face, abd and bilat legs that has been for several days.

## 2020-10-16 NOTE — Telephone Encounter (Signed)
[facility] Day - Client TELEPHONE ADVICE RECORD [facility] Patient Name: DEANDRE STANSEL Gender: Female DOB: November 18, 1940 Age: 80 Y 11 M 20 D Return Phone Number: [phone] (Primary) Address: City/ State/ Zip: [place] [address] Client [facility] Day - Client Client Site [facility] - Day Physician Tillman Abide- MD Contact Type Call Who Is Calling Patient / Member / Family / Caregiver Call Type Triage / Clinical Relationship To Patient Self Return Phone Number [phone] (Primary) Chief Complaint Rash - Widespread Reason for Call Symptomatic / Request for Health Information Initial Comment Caller states she has a red streak on her forehead and spots on her hip. A spot on her knee and streaks on both thighs. They don't itch very much. She is concerned about shingles. One is forming a blister. Translation No Nurse Assessment Nurse: Shon Baton, RN, Patrice Date/Time (Eastern Time): 10/16/2020 10:31:32 AM Confirm and document reason for call. If symptomatic, describe symptoms. ---Patient states she has red spots on her waist line , spots on her hip. knees and thighs,top of forehead. Forming little blisters She thinks maybe shingles. Hx Shingles. Symptoms started noticed Saturday. Starting to be painful, mild forehead. Does the patient have any new or worsening symptoms? ---Yes Will a triage be completed? ---Yes Related visit to physician within the last 2 weeks? ---No Does the PT have any chronic conditions? (i.e. diabetes, asthma, this includes High risk factors for pregnancy, etc.) ---Yes List chronic conditions. ---HTN, Hx Shingles, High Cholesterol Is this a behavioral health or substance abuse call? ---No Guidelines Guideline Title Affirmed Question Affirmed Notes Nurse Date/Time (Eastern Time) Shingles (Zoster) [1] Shingles rash AND [2] spots start appearing other places on  body [employer], RN, Rodell Perna 10/16/2020 10:36:09 AM PLEASE NOTE: All timestamps contained within this report are represented as [place] Standard Time. CONFIDENTIALTY NOTICE: This fax transmission is intended only for the addressee. It contains information that is legally privileged, confidential or otherwise protected from use or disclosure. If you are not the intended recipient, you are strictly prohibited from reviewing, disclosing, copying using or disseminating any of this information or taking any action in reliance on or regarding this information. If you have received this fax in error, please notify us immediately by telephone so that we can arrange for its return to [place]. Phone: [phone], Toll-Free: [phone], Fax: [phone] Page: 2 of 2 Call Id: [id number] Disp. Time Lamount Cohen Time) Disposition Final User 10/16/2020 10:44:10 AM See HCP within 4 Hours (or PCP triage) Yes Shon Baton, RN, Patrice Caller Disagree/Comply Comply Caller Understands Yes PreDisposition Go to Urgent Care/Walk-In Clinic Care Advice Given Per Guideline SEE HCP (OR PCP TRIAGE) WITHIN 4 HOURS: * IF OFFICE WILL BE OPEN: You need to be seen within the next 3 or 4 hours. Call your doctor (or NP/PA) now or as soon as the office opens. CALL BACK IF: * You become worse Comments User: Jake Samples, RN Date/Time (Eastern Time): 10/16/2020 10:44:38 AM Warm transfer to office. Referrals REFERRED TO PCP OFFIC

## 2020-10-16 NOTE — Discharge Instructions (Addendum)
-  Prednisone, 2 pills taken at the same time for 5 days in a row.  Try taking this earlier in the day as it can give you energy. Avoid NSAIDs like ibuprofen and alleve while taking this medication as they can increase your risk of stomach upset and even GI bleeding when in combination with a steroid. You can continue tylenol (acetaminophen) up to 1000mg 3x daily. -Benedryl (diphenhydramine) 25-50mg (1-2 pills) as needed for itching, up to every 6 hours.  This medication will cause drowsiness.  

## 2020-10-16 NOTE — ED Provider Notes (Signed)
[facility]    CSN: [id number] Arrival date & time: 10/16/20  1744      History   Chief Complaint Chief Complaint  Patient presents with   Rash    HPI LAURAJEAN HOSEK is a 80 y.o. female presenting with rash for a few days.  Medical history CKD.  Patient states that she goes outside every day and frequently moves sticks out of the yard, symptoms developed soon after doing this.  Describes itchy rash that started on the forehead and moved down to the arms and the legs.  Few spots on abdomen as well.  Describes as itchy and burning.  Has not tried medications for the symptoms, states that she is content with her symptoms on no medication.  Denies facial involvement including lip, tongue swelling, sensation of throat closing.  HPI  Past Medical History:  Diagnosis Date   Hyperlipidemia    Hypertension    Osteoarthritis, multiple sites     Patient Active Problem List   Diagnosis Date Noted   Lumbar muscle pain 09/06/2020   Stage 3a chronic kidney disease (HCC) 03/14/2019   Advance directive discussed with patient 12/07/2014   Preventative health care 12/05/2013   Hyperlipidemia    Hypertension    Osteoarthritis, multiple sites     Past Surgical History:  Procedure Laterality Date   APPENDECTOMY     CATARACT EXTRACTION W/ INTRAOCULAR LENS  IMPLANT, BILATERAL Bilateral 2012   KNEE ARTHROSCOPY Left 2017   TONSILLECTOMY      OB History   No obstetric history on file.      Home Medications    Prior to Admission medications   Medication Sig Start Date End Date Taking? Authorizing Provider  predniSONE (DELTASONE) 20 MG tablet Take 2 tablets (40 mg total) by mouth daily for 5 days. Take with breakfast or lunch. Avoid NSAIDs (ibuprofen, etc) while taking this medication. 10/16/20 10/21/20 Yes Rhys Martini, PA-C  acetaminophen (TYLENOL) 325 MG tablet Take 650 mg by mouth 3 (three) times daily as needed for mild pain.    [provider]   cholecalciferol (VITAMIN D) 1000 UNITS tablet Take 1,000 Units by mouth daily.    [provider]  losartan (COZAAR) 50 MG tablet TAKE 1 TABLET BY MOUTH  DAILY 07/23/20   Tillman Abide I, MD  metoprolol tartrate (LOPRESSOR) 50 MG tablet TAKE 1 TABLET BY MOUTH  TWICE DAILY 07/23/20   Karie Schwalbe, MD  simvastatin (ZOCOR) 20 MG tablet TAKE 1 TABLET BY MOUTH  DAILY 07/23/20   Karie Schwalbe, MD    Family History Family History  Problem Relation Age of Onset   Stroke Father    Hypertension Sister    Heart disease Sister        heart valve replaced   Diabetes Brother    Hypertension Brother    Diabetes Brother    Hypertension Brother    Hypertension Sister    Hypertension Sister    Hypertension Sister    Cancer Maternal Grandmother        breast cancer    Social History Social History   Tobacco Use   Smoking status: Never   Smokeless tobacco: Never  Substance Use Topics   Alcohol use: No    Alcohol/week: 0.0 standard drinks   Drug use: No     Allergies   Patient has no known allergies.   Review of Systems Review of Systems  Constitutional:  Negative for chills and fever.  HENT:  Negative for sore throat.   Eyes:  Negative for pain and redness.  Respiratory:  Negative for shortness of breath.   Cardiovascular:  Negative for chest pain.  Gastrointestinal:  Negative for abdominal pain, diarrhea, nausea and vomiting.  Genitourinary:  Negative for decreased urine volume, difficulty urinating, dysuria, flank pain, frequency, genital sores, hematuria and urgency.  Musculoskeletal:  Negative for back pain.  Skin:  Positive for color change. Negative for rash.  All other systems reviewed and are negative.   Physical Exam Triage Vital Signs ED Triage Vitals  Enc Vitals Group     BP 10/16/20 1809 (!) 170/89     Pulse Rate 10/16/20 1809 60     Resp 10/16/20 1809 18     Temp 10/16/20 1809 97.6 F (36.4 C)     Temp Source 10/16/20 1809 Oral     SpO2  10/16/20 1809 98 %     Weight --      Height --      Head Circumference --      Peak Flow --      Pain Score 10/16/20 1808 0     Pain Loc --      Pain Edu? --      Excl. in GC? --    No data found.  Updated Vital Signs BP (!) 170/89 (BP Location: Right Arm)   Pulse 60   Temp 97.6 F (36.4 C) (Oral)   Resp 18   SpO2 98%   Visual Acuity Right Eye Distance:   Left Eye Distance:   Bilateral Distance:    Right Eye Near:   Left Eye Near:    Bilateral Near:     Physical Exam Vitals reviewed.  Constitutional:      General: She is not in acute distress.    Appearance: Normal appearance. She is not ill-appearing or diaphoretic.  HENT:     Head: Normocephalic and atraumatic.  Cardiovascular:     Rate and Rhythm: Normal rate and regular rhythm.     Heart sounds: Normal heart sounds.  Pulmonary:     Effort: Pulmonary effort is normal.     Breath sounds: Normal breath sounds.  Skin:    General: Skin is warm.     Comments: See image below Diffuse erythematous urticarial rash with few excoriations. No ocular involvement.   No facial, lip, tongue, uvula swelling. Airway is patent.  Neurological:     General: No focal deficit present.     Mental Status: She is alert and oriented to person, place, and time.  Psychiatric:        Mood and Affect: Mood normal.        Behavior: Behavior normal.        Thought Content: Thought content normal.        Judgment: Judgment normal.       UC Treatments / Results  Labs (all labs ordered are listed, but only abnormal results are displayed) Labs Reviewed - No data to display  EKG   Radiology No results found.  Procedures Procedures (including critical care time)  Medications Ordered in UC Medications - No data to display  Initial Impression / Assessment and Plan / UC Course  I have reviewed the triage vital signs and the nursing notes.  Pertinent labs & imaging results that were available during my care of the patient  were reviewed by me and considered in my medical decision making (see chart for details).     This patient is  a very pleasant 80 y.o. year old female presenting with contact dermatitis. Prednisone, benedryl. ED return precautions discussed. Patient verbalizes understanding and agreement.  .   Final Clinical Impressions(s) / UC Diagnoses   Final diagnoses:  Allergic contact dermatitis due to plants, except food     Discharge Instructions      -Prednisone, 2 pills taken at the same time for 5 days in a row.  Try taking this earlier in the day as it can give you energy. Avoid NSAIDs like ibuprofen and alleve while taking this medication as they can increase your risk of stomach upset and even GI bleeding when in combination with a steroid. You can continue tylenol (acetaminophen) up to 1000mg  3x daily. -Benedryl (diphenhydramine) 25-50mg  (1-2 pills) as needed for itching, up to every 6 hours.  This medication will cause drowsiness.      ED Prescriptions     Medication Sig Dispense Auth. Provider   predniSONE (DELTASONE) 20 MG tablet Take 2 tablets (40 mg total) by mouth daily for 5 days. Take with breakfast or lunch. Avoid NSAIDs (ibuprofen, etc) while taking this medication. 10 tablet , PA-C      PDMP not reviewed this encounter.   Rhys Martini, PA-C 10/16/20 1925

## 2020-10-17 NOTE — Telephone Encounter (Signed)
Spoke to pt. She did go to urgent care. They said it was poison ivy/oak. She will start the prednisone today.

## 2020-12-07 DIAGNOSIS — Z1231 Encounter for screening mammogram for malignant neoplasm of breast: Secondary | ICD-10-CM | POA: Diagnosis not present

## 2020-12-07 LAB — HM MAMMOGRAPHY: HM Mammogram: NORMAL (ref 0–4)

## 2021-01-16 DIAGNOSIS — N2581 Secondary hyperparathyroidism of renal origin: Secondary | ICD-10-CM | POA: Diagnosis not present

## 2021-01-16 DIAGNOSIS — E785 Hyperlipidemia, unspecified: Secondary | ICD-10-CM | POA: Diagnosis not present

## 2021-01-16 DIAGNOSIS — D631 Anemia in chronic kidney disease: Secondary | ICD-10-CM | POA: Diagnosis not present

## 2021-01-16 DIAGNOSIS — N1832 Chronic kidney disease, stage 3b: Secondary | ICD-10-CM | POA: Diagnosis not present

## 2021-01-16 DIAGNOSIS — I129 Hypertensive chronic kidney disease with stage 1 through stage 4 chronic kidney disease, or unspecified chronic kidney disease: Secondary | ICD-10-CM | POA: Diagnosis not present

## 2021-01-16 DIAGNOSIS — R69 Illness, unspecified: Secondary | ICD-10-CM | POA: Diagnosis not present

## 2021-02-04 DIAGNOSIS — N1832 Chronic kidney disease, stage 3b: Secondary | ICD-10-CM | POA: Diagnosis not present

## 2021-03-04 DIAGNOSIS — M79671 Pain in right foot: Secondary | ICD-10-CM | POA: Diagnosis not present

## 2021-03-04 DIAGNOSIS — I739 Peripheral vascular disease, unspecified: Secondary | ICD-10-CM | POA: Diagnosis not present

## 2021-03-04 DIAGNOSIS — L84 Corns and callosities: Secondary | ICD-10-CM | POA: Diagnosis not present

## 2021-03-04 DIAGNOSIS — L603 Nail dystrophy: Secondary | ICD-10-CM | POA: Diagnosis not present

## 2021-03-04 DIAGNOSIS — M79672 Pain in left foot: Secondary | ICD-10-CM | POA: Diagnosis not present

## 2021-03-28 ENCOUNTER — Encounter: Payer: Self-pay | Admitting: Internal Medicine

## 2021-03-28 ENCOUNTER — Other Ambulatory Visit: Payer: Self-pay

## 2021-03-28 ENCOUNTER — Ambulatory Visit (INDEPENDENT_AMBULATORY_CARE_PROVIDER_SITE_OTHER): Payer: Medicare Other | Admitting: Internal Medicine

## 2021-03-28 VITALS — BP 132/84 | HR 56 | Temp 97.3°F | Ht 62.75 in | Wt 191.4 lb

## 2021-03-28 DIAGNOSIS — Z0000 Encounter for general adult medical examination without abnormal findings: Secondary | ICD-10-CM

## 2021-03-28 DIAGNOSIS — N1831 Chronic kidney disease, stage 3a: Secondary | ICD-10-CM

## 2021-03-28 DIAGNOSIS — M150 Primary generalized (osteo)arthritis: Secondary | ICD-10-CM

## 2021-03-28 DIAGNOSIS — E785 Hyperlipidemia, unspecified: Secondary | ICD-10-CM | POA: Diagnosis not present

## 2021-03-28 DIAGNOSIS — M159 Polyosteoarthritis, unspecified: Secondary | ICD-10-CM | POA: Diagnosis not present

## 2021-03-28 DIAGNOSIS — I10 Essential (primary) hypertension: Secondary | ICD-10-CM

## 2021-03-28 DIAGNOSIS — L570 Actinic keratosis: Secondary | ICD-10-CM | POA: Diagnosis not present

## 2021-03-28 NOTE — Assessment & Plan Note (Signed)
BP Readings from Last 3 Encounters:  ?03/28/21 132/84  ?10/16/20 (!) 170/89  ?09/06/20 (!) 184/92  ? ?Controlled with losartan 50 daily and metoprolol 50 bid ? ?

## 2021-03-28 NOTE — Assessment & Plan Note (Signed)
Better now ?Uses tylenol prn ?

## 2021-03-28 NOTE — Assessment & Plan Note (Signed)
I have personally reviewed the Medicare Annual Wellness questionnaire and have noted ?1. The patient's medical and social history ?2. Their use of alcohol, tobacco or illicit drugs ?3. Their current medications and supplements ?4. The patient's functional ability including ADL's, fall risks, home safety risks and hearing or visual ?            impairment. ?5. Diet and physical activities ?6. Evidence for depression or mood disorders ? ?The patients weight, height, BMI and visual acuity have been recorded in the chart ?I have made referrals, counseling and provided education to the patient based review of the above and I have provided the pt with a written personalized care plan for preventive services. ? ?I have provided you with a copy of your personalized plan for preventive services. Please take the time to review along with your updated medication list. ? ?Done with cancer screening ?Not really exercising---discussed restarting ?Recommended flu vaccine in the fall--and consider COVID booster ?Can consider shingrix at the pharmacy ?

## 2021-03-28 NOTE — Progress Notes (Signed)
? ?Subjective:  ? ? Patient ID: Kathryn Lopez, female    DOB: 07/06/1940, 81 y.o.   MRN: [id number] ? ?HPI ?Here for Medicare wellness visit and follow up of chronic health conditions ?Reviewed advanced directives ?Reviewed other doctors--Dr Singh--nephrology is all ?No hospitalizations or surgery ?No alcohol or tobacco ?Vision is fair--mild decline. Hasn't seen eye doctor since cataract surgery ?Feels her hearing is okay ?No falls ?No depression but some stress as family ages and worsening health ?Independent with instrumental ADLs--except shopping (son helps) ?No sig memory issues ? ?Has had some pain in left wrist ?Intermittently feels "like something moves and then it locks" ?Will go away on its own ?Only in past week ?Some other joint pains---used tylenol after mopping or vacuuming (mostly for back) ? ?Sister still living with her---but niece said she will be leaving ?Worsening with some dementia ?Son helps with shopping ? ?GFR went down to 32---but repeat last summer 53 ?Is on losartan and saw nephrologist ? ?No chest pain or SOB ?No dizziness or syncope ?No edema ?No palpitations ? ?No simvastatin still ?No myalgia or GI problems ? ?Current Outpatient Medications on File Prior to Visit  ?Medication Sig Dispense Refill  ? acetaminophen (TYLENOL) 325 MG tablet Take 650 mg by mouth 3 (three) times daily as needed for mild pain.    ? cholecalciferol (VITAMIN D) 1000 UNITS tablet Take 1,000 Units by mouth daily.    ? losartan (COZAAR) 50 MG tablet TAKE 1 TABLET BY MOUTH  DAILY 90 tablet 3  ? metoprolol tartrate (LOPRESSOR) 50 MG tablet TAKE 1 TABLET BY MOUTH  TWICE DAILY 180 tablet 3  ? simvastatin (ZOCOR) 20 MG tablet TAKE 1 TABLET BY MOUTH  DAILY 90 tablet 3  ? ?No current facility-administered medications on file prior to visit.  ? ? ?No Known Allergies ? ?Past Medical History:  ?Diagnosis Date  ? Hyperlipidemia   ? Hypertension   ? Osteoarthritis, multiple sites   ? ? ?Past Surgical History:  ?Procedure  Laterality Date  ? APPENDECTOMY    ? CATARACT EXTRACTION W/ INTRAOCULAR LENS  IMPLANT, BILATERAL Bilateral 2012  ? KNEE ARTHROSCOPY Left 2017  ? TONSILLECTOMY    ? ? ?Family History  ?Problem Relation Age of Onset  ? Stroke Father   ? Hypertension Sister   ? Heart disease Sister   ?     heart valve replaced  ? Diabetes Brother   ? Hypertension Brother   ? Diabetes Brother   ? Hypertension Brother   ? Hypertension Sister   ? Hypertension Sister   ? Hypertension Sister   ? Cancer Maternal Grandmother   ?     breast cancer  ? ? ?Social History  ? ?Socioeconomic History  ? Marital status: Divorced  ?  Spouse name: Not on file  ? Number of children: 1  ? Years of education: Not on file  ? Highest education level: Not on file  ?Occupational History  ? Occupation: Retail  ?  Comment: Retired from [employer]  ?  Employer: RETIRED  ?Tobacco Use  ? Smoking status: Never  ? Smokeless tobacco: Never  ?Substance and Sexual Activity  ? Alcohol use: No  ?  Alcohol/week: 0.0 standard drinks  ? Drug use: No  ? Sexual activity: Not on file  ?Other Topics Concern  ? Not on file  ?Social History Narrative  ? Widowed twice then divorced  ? One son  ?   ? No living will  ? Would have son  be health care POA if needed  ? Not sure about DNR-- very adamant about not being kept on life support  ? No feeding tube!!  ? ?Social Determinants of Health  ? ?Financial Resource Strain: Not on file  ?Food Insecurity: Not on file  ?Transportation Needs: Not on file  ?Physical Activity: Not on file  ?Stress: Not on file  ?Social Connections: Not on file  ?Intimate Partner Violence: Not on file  ? ?Review of Systems ?Appetite is good ?Weight is down a few pounds ?Sleeps okay in general---wakes for nocturia (but gets back to sleep) ?No daytime urinary problems. Some incontinence--wears pad ?Bowels tend to be "firm". Some trouble ---stool softeners help but she is trying to avoid regular use ?Wears seat belt ?Full dentures--no problems ?Gets some age spots--no  concerning lesions ? ?   ?Objective:  ? Physical Exam ?Constitutional:   ?   Appearance: Normal appearance.  ?HENT:  ?   Mouth/Throat:  ?   Comments: No lesions ?Eyes:  ?   Extraocular Movements: Extraocular movements intact.  ?   Pupils: Pupils are equal, round, and reactive to light.  ?Cardiovascular:  ?   Rate and Rhythm: Normal rate and regular rhythm.  ?   Pulses: Normal pulses.  ?   Heart sounds: No murmur heard. ?  No gallop.  ?Pulmonary:  ?   Effort: Pulmonary effort is normal.  ?   Breath sounds: Normal breath sounds. No wheezing or rales.  ?Abdominal:  ?   Palpations: Abdomen is soft.  ?   Tenderness: There is no abdominal tenderness.  ?Musculoskeletal:  ?   Cervical back: Neck supple.  ?   Right lower leg: No edema.  ?   Left lower leg: No edema.  ?Lymphadenopathy:  ?   Cervical: No cervical adenopathy.  ?Skin: ?   Findings: No rash.  ?   Comments: Several persistent scaly areas---arms, face  ?Neurological:  ?   General: No focal deficit present.  ?   Mental Status: She is alert and oriented to person, place, and time.  ?   Comments: Mini-cog ----clock normal, recall 2/3  ?Psychiatric:     ?   Mood and Affect: Mood normal.     ?   Behavior: Behavior normal.  ?  ? ? ? ? ?   ?Assessment & Plan:  ? ?

## 2021-03-28 NOTE — Assessment & Plan Note (Signed)
Had worsened then last test was better ?Will recheck ?On losartan 50 daily ?

## 2021-03-28 NOTE — Assessment & Plan Note (Signed)
No problems with simvastatin 20mg  as primary prevention ?

## 2021-03-29 LAB — LDL CHOLESTEROL, DIRECT: Direct LDL: 92 mg/dL

## 2021-03-29 LAB — CBC
HCT: 42.1 % (ref 36.0–46.0)
Hemoglobin: 14.2 g/dL (ref 12.0–15.0)
MCHC: 33.8 g/dL (ref 30.0–36.0)
MCV: 91.3 fl (ref 78.0–100.0)
Platelets: 192 10*3/uL (ref 150.0–400.0)
RBC: 4.61 Mil/uL (ref 3.87–5.11)
RDW: 13 % (ref 11.5–15.5)
WBC: 5.3 10*3/uL (ref 4.0–10.5)

## 2021-03-29 LAB — RENAL FUNCTION PANEL
Albumin: 4.3 g/dL (ref 3.5–5.2)
BUN: 12 mg/dL (ref 6–23)
CO2: 27 mEq/L (ref 19–32)
Calcium: 9.2 mg/dL (ref 8.4–10.5)
Chloride: 102 mEq/L (ref 96–112)
Creatinine, Ser: 0.98 mg/dL (ref 0.40–1.20)
GFR: 53.79 mL/min — ABNORMAL LOW (ref >60.00)
Glucose, Bld: 80 mg/dL (ref 70–99)
Phosphorus: 3.7 mg/dL (ref 2.3–4.6)
Potassium: 4.2 mEq/L (ref 3.5–5.1)
Sodium: 135 mEq/L (ref 135–145)

## 2021-03-29 LAB — HEPATIC FUNCTION PANEL
ALT: 19 U/L (ref 0–35)
AST: 21 U/L (ref 0–37)
Albumin: 4.3 g/dL (ref 3.5–5.2)
Alkaline Phosphatase: 62 U/L (ref 39–117)
Bilirubin, Direct: 0.1 mg/dL (ref 0.0–0.3)
Total Bilirubin: 0.5 mg/dL (ref 0.2–1.2)
Total Protein: 6.9 g/dL (ref 6.0–8.3)

## 2021-03-29 LAB — LIPID PANEL
Cholesterol: 164 mg/dL (ref 0–200)
HDL: 51.5 mg/dL (ref >39.00)
NonHDL: 112.39
Total CHOL/HDL Ratio: 3
Triglycerides: 204 mg/dL — ABNORMAL HIGH (ref 0.0–149.0)
VLDL: 40.8 mg/dL — ABNORMAL HIGH (ref 0.0–40.0)

## 2021-03-29 NOTE — Addendum Note (Signed)
Addended by: Tillman Abide I on: 03/29/2021 08:12 AM ? ? Modules accepted: Orders ? ?

## 2021-05-30 ENCOUNTER — Other Ambulatory Visit: Payer: Self-pay | Admitting: Internal Medicine

## 2021-05-31 ENCOUNTER — Other Ambulatory Visit: Payer: Self-pay | Admitting: Internal Medicine

## 2021-06-05 DIAGNOSIS — I739 Peripheral vascular disease, unspecified: Secondary | ICD-10-CM | POA: Diagnosis not present

## 2021-06-05 DIAGNOSIS — L84 Corns and callosities: Secondary | ICD-10-CM | POA: Diagnosis not present

## 2021-06-05 DIAGNOSIS — L603 Nail dystrophy: Secondary | ICD-10-CM | POA: Diagnosis not present

## 2021-06-08 ENCOUNTER — Other Ambulatory Visit: Payer: Self-pay

## 2021-06-08 ENCOUNTER — Emergency Department (HOSPITAL_COMMUNITY)
Admission: EM | Admit: 2021-06-08 | Discharge: 2021-06-09 | Disposition: A | Discharge status: Home / Self Care | Payer: Medicare Other | Attending: Emergency Medicine | Admitting: Emergency Medicine

## 2021-06-08 ENCOUNTER — Encounter (HOSPITAL_COMMUNITY): Payer: Self-pay

## 2021-06-08 DIAGNOSIS — Z79899 Other long term (current) drug therapy: Secondary | ICD-10-CM | POA: Insufficient documentation

## 2021-06-08 DIAGNOSIS — M25519 Pain in unspecified shoulder: Secondary | ICD-10-CM | POA: Diagnosis not present

## 2021-06-08 DIAGNOSIS — R258 Other abnormal involuntary movements: Secondary | ICD-10-CM | POA: Diagnosis not present

## 2021-06-08 DIAGNOSIS — M25559 Pain in unspecified hip: Secondary | ICD-10-CM | POA: Diagnosis not present

## 2021-06-08 DIAGNOSIS — M549 Dorsalgia, unspecified: Secondary | ICD-10-CM | POA: Insufficient documentation

## 2021-06-08 DIAGNOSIS — R55 Syncope and collapse: Secondary | ICD-10-CM | POA: Insufficient documentation

## 2021-06-08 DIAGNOSIS — I10 Essential (primary) hypertension: Secondary | ICD-10-CM | POA: Diagnosis not present

## 2021-06-08 DIAGNOSIS — R079 Chest pain, unspecified: Secondary | ICD-10-CM | POA: Diagnosis not present

## 2021-06-08 NOTE — ED Triage Notes (Signed)
Patient said she was sitting at a picnic table, felt like she was going to pass out, then she woke up on the ground. She said she does not think she has drank as much as she should've today and was outside tonight in the heat.

## 2021-06-09 ENCOUNTER — Emergency Department (HOSPITAL_COMMUNITY): Payer: Medicare Other

## 2021-06-09 DIAGNOSIS — R55 Syncope and collapse: Secondary | ICD-10-CM | POA: Diagnosis not present

## 2021-06-09 DIAGNOSIS — R079 Chest pain, unspecified: Secondary | ICD-10-CM | POA: Diagnosis not present

## 2021-06-09 LAB — URINALYSIS, ROUTINE W REFLEX MICROSCOPIC
Bilirubin Urine: NEGATIVE
Glucose, UA: NEGATIVE mg/dL
Hgb urine dipstick: NEGATIVE
Ketones, ur: NEGATIVE mg/dL
Nitrite: NEGATIVE
Protein, ur: NEGATIVE mg/dL
Specific Gravity, Urine: 1.009 (ref 1.005–1.030)
pH: 6 (ref 5.0–8.0)

## 2021-06-09 LAB — COMPREHENSIVE METABOLIC PANEL
ALT: 23 U/L (ref 0–44)
AST: 29 U/L (ref 15–41)
Albumin: 4.1 g/dL (ref 3.5–5.0)
Alkaline Phosphatase: 57 U/L (ref 38–126)
Anion gap: 11 (ref 5–15)
BUN: 12 mg/dL (ref 8–23)
CO2: 23 mmol/L (ref 22–32)
Calcium: 9.2 mg/dL (ref 8.9–10.3)
Chloride: 101 mmol/L (ref 98–111)
Creatinine, Ser: 1.27 mg/dL — ABNORMAL HIGH (ref 0.44–1.00)
GFR, Estimated: 42 mL/min — ABNORMAL LOW (ref >60)
Glucose, Bld: 118 mg/dL — ABNORMAL HIGH (ref 70–99)
Potassium: 3.9 mmol/L (ref 3.5–5.1)
Sodium: 135 mmol/L (ref 135–145)
Total Bilirubin: 0.8 mg/dL (ref 0.3–1.2)
Total Protein: 7.2 g/dL (ref 6.5–8.1)

## 2021-06-09 LAB — CBG MONITORING, ED: Glucose-Capillary: 119 mg/dL — ABNORMAL HIGH (ref 70–99)

## 2021-06-09 LAB — CBC
HCT: 43.6 % (ref 36.0–46.0)
Hemoglobin: 14.1 g/dL (ref 12.0–15.0)
MCH: 30.3 pg (ref 26.0–34.0)
MCHC: 32.3 g/dL (ref 30.0–36.0)
MCV: 93.8 fL (ref 80.0–100.0)
Platelets: 177 10*3/uL (ref 150–400)
RBC: 4.65 MIL/uL (ref 3.87–5.11)
RDW: 12.7 % (ref 11.5–15.5)
WBC: 11.5 10*3/uL — ABNORMAL HIGH (ref 4.0–10.5)
nRBC: 0 % (ref 0.0–0.2)

## 2021-06-09 LAB — TROPONIN I (HIGH SENSITIVITY)
Troponin I (High Sensitivity): 3 ng/L (ref <18)
Troponin I (High Sensitivity): 3 ng/L (ref <18)

## 2021-06-09 MED ORDER — ACETAMINOPHEN 325 MG PO TABS
650.0000 mg | ORAL_TABLET | Freq: Once | ORAL | Status: AC
  Administered 2021-06-09: 650 mg via ORAL
  Filled 2021-06-09: qty 2

## 2021-06-09 MED ORDER — SODIUM CHLORIDE 0.9 % IV BOLUS
500.0000 mL | Freq: Once | INTRAVENOUS | Status: AC
  Administered 2021-06-09: 500 mL via INTRAVENOUS

## 2021-06-09 NOTE — ED Provider Notes (Signed)
[facility]-EMERGENCY DEPT Provider Note   CSN: [id number] Arrival date & time: 06/08/21  2345     History  Chief Complaint  Patient presents with   Loss of Consciousness    Kathryn Lopez is a 81 y.o. female.  The history is provided by the patient and a relative.  Loss of Consciousness Kathryn Lopez is a 81 y.o. female who presents to the Emergency Department complaining of syncope.  She presents to the emergency department accompanied by her son for evaluation following a syncopal episode.  She had been frequently in and out of the house today.  Around 945 this evening she had gone outside to sit at the picnic table and she started to feel lightheaded.  She started to lean and fell to the ground.  She did not fall from a very high height and is unsure if she hit her head.  Her son was quickly at her side and states that she was unconscious for less than 1 minute.  He helped her get up in a chair and she had a second episode where she began to lose consciousness.  She did briefly shake prior to this episode.  He did later back on the ground and she quickly returned to consciousness.  She was unconscious for less than 1 minute the second episode as well.  She was able to communicate between episodes.  No postictal period.  She had eaten just prior to this event.  No associated fever, chest pain, nausea, vomiting, diarrhea, dysuria.  She did have a history of frequent fainting episodes but it has been more than 20 years since her last episode.  She does have a history of hypertension, hyperlipidemia and does report drinking less fluids today.  She does have some mild shoulder discomfort, hip discomfort and back discomfort after her episode.  She has been able to walk since this happened.   Home Medications Prior to Admission medications   Medication Sig Start Date End Date Taking? Authorizing Provider  acetaminophen (TYLENOL) 325 MG tablet Take 650 mg by mouth 3  (three) times daily as needed for mild pain.   Yes [provider]  cholecalciferol (VITAMIN D) 1000 UNITS tablet Take 1,000 Units by mouth daily.   Yes [provider]  losartan (COZAAR) 50 MG tablet TAKE 1 TABLET BY MOUTH  DAILY Patient taking differently: Take 50 mg by mouth daily. 05/31/21  Yes Karie Schwalbe, MD  metoprolol tartrate (LOPRESSOR) 50 MG tablet TAKE 1 TABLET BY MOUTH  TWICE DAILY Patient taking differently: Take 50 mg by mouth 2 (two) times daily. 05/31/21  Yes Karie Schwalbe, MD  simvastatin (ZOCOR) 20 MG tablet TAKE 1 TABLET BY MOUTH  DAILY Patient taking differently: Take 20 mg by mouth daily. 05/31/21  Yes Karie Schwalbe, MD      Allergies    Patient has no known allergies.    Review of Systems   Review of Systems  Cardiovascular:  Positive for syncope.  All other systems reviewed and are negative.  Physical Exam Updated Vital Signs BP (!) 151/70   Pulse (!) 56   Temp 97.6 F (36.4 C) (Oral)   Resp (!) 23   SpO2 96%  Physical Exam Vitals and nursing note reviewed.  Constitutional:      Appearance: She is well-developed.  HENT:     Head: Normocephalic and atraumatic.  Cardiovascular:     Rate and Rhythm: Normal rate and regular rhythm.  Heart sounds: No murmur heard. Pulmonary:     Effort: Pulmonary effort is normal. No respiratory distress.     Breath sounds: Normal breath sounds.  Abdominal:     Palpations: Abdomen is soft.     Tenderness: There is no abdominal tenderness. There is no guarding or rebound.  Musculoskeletal:        General: No swelling or tenderness.  Skin:    General: Skin is warm and dry.  Neurological:     Mental Status: She is alert and oriented to person, place, and time.     Comments: 5 out of 5 strength in all 4 extremities with sensation to light touch intact in all 4 extremities.  Psychiatric:        Behavior: Behavior normal.    ED Results / Procedures / Treatments   Labs (all labs ordered  are listed, but only abnormal results are displayed) Labs Reviewed  CBC - Abnormal; Notable for the following components:      Result Value   WBC 11.5 (*)    All other components within normal limits  URINALYSIS, ROUTINE W REFLEX MICROSCOPIC - Abnormal; Notable for the following components:   Leukocytes,Ua MODERATE (*)    Bacteria, UA RARE (*)    All other components within normal limits  COMPREHENSIVE METABOLIC PANEL - Abnormal; Notable for the following components:   Glucose, Bld 118 (*)    Creatinine, Ser 1.27 (*)    GFR, Estimated 42 (*)    All other components within normal limits  CBG MONITORING, ED - Abnormal; Notable for the following components:   Glucose-Capillary 119 (*)    All other components within normal limits  TROPONIN I (HIGH SENSITIVITY)  TROPONIN I (HIGH SENSITIVITY)    EKG EKG Interpretation  Date/Time:  Saturday June 08 2021 23:47:04 EDT Ventricular Rate:  59 PR Interval:  181 QRS Duration: 106 QT Interval:  439 QTC Calculation: 435 R Axis:   36 Text Interpretation: Sinus rhythm No previous tracing Confirmed by Tilden Fossaees, Nat Lowenthal [phone]([id number]) on 06/09/2021 12:18:07 AM  Radiology DG Chest 2 View  Result Date: 06/09/2021 CLINICAL DATA:  Syncope, back pain. EXAM: CHEST - 2 VIEW COMPARISON:  None Available. FINDINGS: Mild cardiomegaly. Normal mediastinal contours. No pulmonary edema, focal airspace disease, pleural effusion or pneumothorax. Thoracic spondylosis. No evidence of acute fracture. IMPRESSION: Mild cardiomegaly. Electronically Signed   By: Narda RutherfordMelanie  Sanford M.D.   On: 06/09/2021 01:03   CT Head Wo Contrast  Result Date: 06/09/2021 CLINICAL DATA:  Head trauma syncope EXAM: CT HEAD WITHOUT CONTRAST TECHNIQUE: Contiguous axial images were obtained from the base of the skull through the vertex without intravenous contrast. RADIATION DOSE REDUCTION: This exam was performed according to the departmental dose-optimization program which includes automated exposure  control, adjustment of the mA and/or kV according to patient size and/or use of iterative reconstruction technique. COMPARISON:  CT brain report 02/19/2007 FINDINGS: Brain: No acute territorial infarction, hemorrhage or intracranial mass. Mild atrophy. Minimal chronic small vessel ischemic change of the white matter. Chronic appearing lacunar infarct in the right basal ganglia and white matter. Vascular: No hyperdense vessels.  Carotid vascular calcification Skull: Normal. Negative for fracture or focal lesion. Sinuses/Orbits: No acute finding. Other: None IMPRESSION: 1. No definite CT evidence for acute intracranial abnormality. 2. Mild atrophy and minimal chronic small vessel ischemic changes of the white matter Electronically Signed   By: Jasmine PangKim  Fujinaga M.D.   On: 06/09/2021 02:58    Procedures Procedures    Medications Ordered  in ED Medications  acetaminophen (TYLENOL) tablet 650 mg (650 mg Oral Given 06/09/21 0112)  sodium chloride 0.9 % bolus 500 mL (0 mLs Intravenous Stopped 06/09/21 0208)    ED Course/ Medical Decision Making/ A&P                           Medical Decision Making Amount and/or Complexity of Data Reviewed Labs: ordered. Radiology: ordered.  Risk OTC drugs.   Patient with history of hypertension, hyperlipidemia here for evaluation following syncopal event.  Patient is well-appearing on evaluation and relatively asymptomatic.  She does have some mild soreness to her left shoulder and left hip but she is able to range both areas without difficulty with no tenderness to palpation.  Normal gait.  No clinical evidence of fracture or dislocation.  Given her syncopal event labs were obtained.  No significant anemia or electrolyte abnormality.  Troponins are negative x2.  No clinical evidence of life-threatening arrhythmia.  Discussed with patient and son home care for syncope.  Discussed importance of outpatient follow-up as well as return precautions.        Final Clinical  Impression(s) / ED Diagnoses Final diagnoses:  Syncope and collapse    Rx / DC Orders ED Discharge Orders     None         Tilden Fossa, MD 06/09/21 [phone]

## 2021-06-09 NOTE — ED Triage Notes (Signed)
Troponin was sent down for patient at 0027 and it didn't result until 0213

## 2021-06-10 ENCOUNTER — Telehealth: Payer: Self-pay | Admitting: Internal Medicine

## 2021-06-10 NOTE — Telephone Encounter (Signed)
Spoke to pt. Advised her Dr Silvio Pate is out of the office this week and will be back Monday. I will let her know what he says next week. We will keep the appt until we see what he says.

## 2021-06-10 NOTE — Telephone Encounter (Signed)
Pt called and said that she was seen in the hospital on 06/08/21, they said everything was fine according to her but wanted her to still follow up with Dr Silvio Pate. I scheduled her with his next available that she could do for time of the day because she cant do early, which is on 06/25/21. She wanted to know if Dr Silvio Pate even needs to see her and if that will work. [phone]

## 2021-06-13 NOTE — Telephone Encounter (Signed)
Hospital follow up is scheduled for 06/25/21 at 11:00 am.

## 2021-06-25 ENCOUNTER — Encounter: Payer: Self-pay | Admitting: Internal Medicine

## 2021-06-25 ENCOUNTER — Ambulatory Visit (INDEPENDENT_AMBULATORY_CARE_PROVIDER_SITE_OTHER): Payer: Medicare Other | Admitting: Internal Medicine

## 2021-06-25 DIAGNOSIS — I10 Essential (primary) hypertension: Secondary | ICD-10-CM | POA: Diagnosis not present

## 2021-06-25 DIAGNOSIS — N1831 Chronic kidney disease, stage 3a: Secondary | ICD-10-CM | POA: Diagnosis not present

## 2021-06-25 DIAGNOSIS — R55 Syncope and collapse: Secondary | ICD-10-CM | POA: Diagnosis not present

## 2021-06-25 MED ORDER — METOPROLOL SUCCINATE ER 50 MG PO TB24: 50.0000 mg | ORAL_TABLET | Freq: Every day | ORAL | 3 refills | Status: DC

## 2021-06-25 NOTE — Assessment & Plan Note (Addendum)
History doesn't support seizure, cardiac ischemia or arrhythmia Hadn't drank much water that day--though no clear symptoms of hypovolemia Had eaten--no reason to consider hypoglycemia  No further work up based on history and ER records

## 2021-06-25 NOTE — Assessment & Plan Note (Signed)
BP Readings from Last 3 Encounters:  06/25/21 138/80  06/09/21 (!) 151/70  03/28/21 132/84   Tends to run pretty good The beta blocker could increase risk of syncope----and she has had systolic BP as low as 107 Will change the metoprolol to succinate 50 daily (instead of tartrate 50 bid) Continue losartan 50

## 2021-06-25 NOTE — Patient Instructions (Signed)
Please cut your current metoprolol in half and take 25 twice a day. Then switch to the one a day 50mg  (metoprolol succinate) once that comes from 

## 2021-06-25 NOTE — Progress Notes (Signed)
Subjective:    Patient ID: Kathryn Lopez, female    DOB: 05/26/1940, 81 y.o.   MRN: [id number]  HPI Here for ER follow up after ER visit for fainting spells  Son was at her house---helping with yard work When they were getting ready to leave at night--was sitting at the picnic table She felt funny--but then fell over before she could say something Was on the ground---son called 911 Bruised right arm Was out less than a minute---could vaguely hear son in the backround Rolled onto back--then she sat up and she started looking glassy eyed again Ambulance had her lie back down--and they checked her out Then son brought her to ER  Had eaten with family May not have drank much water  Reviewed notes there Troponins negative Head CT benign CXR benign (mild cardiology) GFR 53----no other significant abnormalities  Has fainted several times in the past--nothing recent Like at family reunions as teenager  She does check her BP Usually in the 120's systolic---and not more than 130's Lowest 107 systolic  Current Outpatient Medications on File Prior to Visit  Medication Sig Dispense Refill   acetaminophen (TYLENOL) 325 MG tablet Take 650 mg by mouth 3 (three) times daily as needed for mild pain.     cholecalciferol (VITAMIN D) 1000 UNITS tablet Take 1,000 Units by mouth daily.     losartan (COZAAR) 50 MG tablet TAKE 1 TABLET BY MOUTH  DAILY (Patient taking differently: Take 50 mg by mouth daily.) 100 tablet 3   metoprolol tartrate (LOPRESSOR) 50 MG tablet TAKE 1 TABLET BY MOUTH  TWICE DAILY (Patient taking differently: Take 50 mg by mouth 2 (two) times daily.) 200 tablet 3   simvastatin (ZOCOR) 20 MG tablet TAKE 1 TABLET BY MOUTH  DAILY (Patient taking differently: Take 20 mg by mouth daily.) 100 tablet 3   No current facility-administered medications on file prior to visit.    No Known Allergies  Past Medical History:  Diagnosis Date   Hyperlipidemia    Hypertension     Osteoarthritis, multiple sites     Past Surgical History:  Procedure Laterality Date   APPENDECTOMY     CATARACT EXTRACTION W/ INTRAOCULAR LENS  IMPLANT, BILATERAL Bilateral 2012   KNEE ARTHROSCOPY Left 2017   TONSILLECTOMY      Family History  Problem Relation Age of Onset   Stroke Father    Hypertension Sister    Heart disease Sister        heart valve replaced   Diabetes Brother    Hypertension Brother    Diabetes Brother    Hypertension Brother    Hypertension Sister    Hypertension Sister    Hypertension Sister    Cancer Maternal Grandmother        breast cancer    Social History   Socioeconomic History   Marital status: Divorced    Spouse name: Not on file   Number of children: 1   Years of education: Not on file   Highest education level: Not on file  Occupational History   Occupation: Retail    Comment: Retired from [employer]    Employer: RETIRED  Tobacco Use   Smoking status: Never   Smokeless tobacco: Never  Substance and Sexual Activity   Alcohol use: No    Alcohol/week: 0.0 standard drinks of alcohol   Drug use: No   Sexual activity: Not on file  Other Topics Concern   Not on file  Social History Narrative  Widowed twice then divorced   One son      No living will   Would have son be health care POA if needed   Not sure about DNR-- very adamant about not being kept on life support   No feeding tube!!   Social Determinants of Health   Financial Resource Strain: Not on file  Food Insecurity: Not on file  Transportation Needs: Not on file  Physical Activity: Not on file  Stress: Not on file  Social Connections: Not on file  Intimate Partner Violence: Not on file   Review of Systems No dizziness or orthostatic symptoms No palpitations No chest pain or SOB No edema Appetite is good No edema    Objective:   Physical Exam Constitutional:      Appearance: Normal appearance.  Cardiovascular:     Rate and Rhythm: Normal rate and  regular rhythm.     Pulses: Normal pulses.     Heart sounds: No murmur heard.    No gallop.  Pulmonary:     Effort: Pulmonary effort is normal.     Breath sounds: Normal breath sounds. No wheezing or rales.  Abdominal:     Palpations: Abdomen is soft.     Tenderness: There is no abdominal tenderness.  Musculoskeletal:     Cervical back: Neck supple.     Right lower leg: No edema.     Left lower leg: No edema.  Lymphadenopathy:     Cervical: No cervical adenopathy.  Neurological:     Mental Status: She is alert.  Psychiatric:        Mood and Affect: Mood normal.        Behavior: Behavior normal.            Assessment & Plan:

## 2021-06-25 NOTE — Assessment & Plan Note (Signed)
GFR okay on the losartan 50

## 2021-06-26 ENCOUNTER — Telehealth: Payer: Self-pay | Admitting: Internal Medicine

## 2021-06-26 NOTE — Telephone Encounter (Signed)
Spoke to pt. Please cut your current metoprolol in half and take 25 twice a day. Then switch to the one a day 50mg  (metoprolol succinate) once that comes from 

## 2021-06-26 NOTE — Telephone Encounter (Signed)
Pt called in asking about losartan (COZAAR) 50 MG tablet, she said she thinks [facility] wanted her to split the pill in half but she forgot which medication to split. Says it is not stated on her paper from him. Wants to speak with the nurse, please advise when possible.  Callback Number: [phone]

## 2021-07-04 ENCOUNTER — Telehealth: Payer: Self-pay | Admitting: Internal Medicine

## 2021-07-04 NOTE — Telephone Encounter (Signed)
Patient called because she said the home health person came out yesterday and said they were concerned with patients BP, she said when she checked it yesterday morning it was at 122/68 and after the [facility] person got there they checked it and it was at 142/88. She said when she checked today it was 109/62 and then when she took it around 11 today it 144/71. They wanted her to let Dr Alphonsus Sias know

## 2021-07-04 NOTE — Telephone Encounter (Signed)
Tried to call pt but no answer. Need to know if she feels different when it is lower or higher.

## 2021-07-05 NOTE — Telephone Encounter (Signed)
None of those measurements is concerning No action is needed

## 2021-07-05 NOTE — Telephone Encounter (Signed)
Spoke to pt. She said she feels fine. Was just doing what the nurse told her to do.

## 2021-07-31 ENCOUNTER — Telehealth: Payer: Self-pay | Admitting: Internal Medicine

## 2021-07-31 NOTE — Telephone Encounter (Signed)
Patient called and said that she picked up some OTC Zyrtec and it said to contact provider before taking if you're over 83 years old. She just wanted to know if its okay to take. Call back is [phone]

## 2021-08-01 NOTE — Telephone Encounter (Signed)
Spoke to pt

## 2021-08-19 DIAGNOSIS — N1832 Chronic kidney disease, stage 3b: Secondary | ICD-10-CM | POA: Diagnosis not present

## 2021-08-27 DIAGNOSIS — N2581 Secondary hyperparathyroidism of renal origin: Secondary | ICD-10-CM | POA: Diagnosis not present

## 2021-08-27 DIAGNOSIS — I129 Hypertensive chronic kidney disease with stage 1 through stage 4 chronic kidney disease, or unspecified chronic kidney disease: Secondary | ICD-10-CM | POA: Diagnosis not present

## 2021-08-27 DIAGNOSIS — N1832 Chronic kidney disease, stage 3b: Secondary | ICD-10-CM | POA: Diagnosis not present

## 2021-08-27 DIAGNOSIS — D631 Anemia in chronic kidney disease: Secondary | ICD-10-CM | POA: Diagnosis not present

## 2021-09-06 DIAGNOSIS — L84 Corns and callosities: Secondary | ICD-10-CM | POA: Diagnosis not present

## 2021-09-06 DIAGNOSIS — L603 Nail dystrophy: Secondary | ICD-10-CM | POA: Diagnosis not present

## 2021-09-06 DIAGNOSIS — I739 Peripheral vascular disease, unspecified: Secondary | ICD-10-CM | POA: Diagnosis not present

## 2021-12-09 DIAGNOSIS — L84 Corns and callosities: Secondary | ICD-10-CM | POA: Diagnosis not present

## 2021-12-09 DIAGNOSIS — L603 Nail dystrophy: Secondary | ICD-10-CM | POA: Diagnosis not present

## 2021-12-09 DIAGNOSIS — I739 Peripheral vascular disease, unspecified: Secondary | ICD-10-CM | POA: Diagnosis not present

## 2021-12-13 DIAGNOSIS — Z1231 Encounter for screening mammogram for malignant neoplasm of breast: Secondary | ICD-10-CM | POA: Diagnosis not present

## 2021-12-13 LAB — HM MAMMOGRAPHY

## 2021-12-23 ENCOUNTER — Encounter: Payer: Self-pay | Admitting: Internal Medicine

## 2022-02-11 ENCOUNTER — Telehealth: Payer: Self-pay

## 2022-02-11 MED ORDER — LOSARTAN POTASSIUM 50 MG PO TABS: 50.0000 mg | ORAL_TABLET | Freq: Every day | ORAL | 3 refills | Status: DC

## 2022-02-11 MED ORDER — SIMVASTATIN 20 MG PO TABS: 20.0000 mg | ORAL_TABLET | Freq: Every day | ORAL | 3 refills | Status: DC

## 2022-02-11 MED ORDER — METOPROLOL SUCCINATE ER 50 MG PO TB24: 50.0000 mg | ORAL_TABLET | Freq: Every day | ORAL | 3 refills | Status: DC

## 2022-02-11 NOTE — Telephone Encounter (Signed)
Rx sent electronically.  

## 2022-02-12 ENCOUNTER — Other Ambulatory Visit: Payer: Self-pay | Admitting: Internal Medicine

## 2022-03-10 DIAGNOSIS — L603 Nail dystrophy: Secondary | ICD-10-CM | POA: Diagnosis not present

## 2022-03-10 DIAGNOSIS — I739 Peripheral vascular disease, unspecified: Secondary | ICD-10-CM | POA: Diagnosis not present

## 2022-03-10 DIAGNOSIS — L84 Corns and callosities: Secondary | ICD-10-CM | POA: Diagnosis not present

## 2022-03-31 ENCOUNTER — Ambulatory Visit (INDEPENDENT_AMBULATORY_CARE_PROVIDER_SITE_OTHER): Payer: Medicare HMO | Admitting: Internal Medicine

## 2022-03-31 ENCOUNTER — Encounter: Payer: Self-pay | Admitting: Internal Medicine

## 2022-03-31 VITALS — BP 120/70 | HR 56 | Temp 97.9°F | Ht 62.5 in | Wt 170.0 lb

## 2022-03-31 DIAGNOSIS — Z23 Encounter for immunization: Secondary | ICD-10-CM | POA: Diagnosis not present

## 2022-03-31 DIAGNOSIS — I10 Essential (primary) hypertension: Secondary | ICD-10-CM

## 2022-03-31 DIAGNOSIS — Z0000 Encounter for general adult medical examination without abnormal findings: Secondary | ICD-10-CM | POA: Diagnosis not present

## 2022-03-31 DIAGNOSIS — N1831 Chronic kidney disease, stage 3a: Secondary | ICD-10-CM

## 2022-03-31 DIAGNOSIS — M159 Polyosteoarthritis, unspecified: Secondary | ICD-10-CM | POA: Diagnosis not present

## 2022-03-31 DIAGNOSIS — F39 Unspecified mood [affective] disorder: Secondary | ICD-10-CM | POA: Diagnosis not present

## 2022-03-31 NOTE — Progress Notes (Signed)
Vision Screening   Right eye Left eye Both eyes  Without correction 20/40 20/40 20/50   With correction     Hearing Screening - Comments:: Passed whisper test

## 2022-03-31 NOTE — Assessment & Plan Note (Signed)
No recent problems Uses tylenol prn

## 2022-03-31 NOTE — Assessment & Plan Note (Signed)
BP Readings from Last 3 Encounters:  03/31/22 120/70  06/25/21 138/80  06/09/21 (!) 151/70   Good control on losartan 50 and metoprolol 50mg  daily Will check labs

## 2022-03-31 NOTE — Addendum Note (Signed)
Addended by: Pilar Grammes on: 03/31/2022 04:16 PM   Modules accepted: Orders

## 2022-03-31 NOTE — Assessment & Plan Note (Signed)
Will recheck On the losartan

## 2022-03-31 NOTE — Patient Instructions (Signed)
I recommend a COVID vaccine as well as tetanus booster and shingrix vaccine at the pharmacy. In the fall, you will be due for the flu vaccine and RSV

## 2022-03-31 NOTE — Assessment & Plan Note (Signed)
Having adjustment issues with recent forced mood She is doing better now Meds not indicated

## 2022-03-31 NOTE — Progress Notes (Signed)
Subjective:    Patient ID: Kathryn Lopez, female    DOB: 15-Sep-1940, 82 y.o.   MRN: [id number]  HPI Here for Medicare wellness visit and follow up of chronic health conditions Reviewed advanced directives Reviewed other doctors--Dr Singh-nephrology, Dr Lonia Chimera, [facility] No hospitalizations or surgery in the past year Walking a little No alcohol or tobacco Vision is fair Hearing is about the same--not a problem [address] once with faint--no injury Some mood issues--stress Helps with housework--son does the shopping Mild memory issues--nothing worrisome  Had to move to [place]---"bad situation" Had been living in parent's old home---sibs wanted to sell it Now living with son---he has to help with instrumental ADLs anyway Not such a bad thing except for her being pushed out Was aggravated but not really depressed. Adjusting to it now. Not anhedonic--back to church (doesn't drive)  Still sees nephrologist On losartan Last GFR up to 54 from 42  No chest pain or SOB No dizziness or syncope No edema No palpitations Some frontal headaches-and in neck---relates to new bed and some stress Checks BP at home---usually 130's/70's or better----rarely higher  Current Outpatient Medications on File Prior to Visit  Medication Sig Dispense Refill   acetaminophen (TYLENOL) 325 MG tablet Take 650 mg by mouth 3 (three) times daily as needed for mild pain.     cholecalciferol (VITAMIN D) 1000 UNITS tablet Take 1,000 Units by mouth daily.     losartan (COZAAR) 50 MG tablet Take 1 tablet (50 mg total) by mouth daily. 100 tablet 3   metoprolol succinate (TOPROL-XL) 50 MG 24 hr tablet Take 1 tablet (50 mg total) by mouth daily. Take with or immediately following a meal. 90 tablet 3   simvastatin (ZOCOR) 20 MG tablet Take 1 tablet (20 mg total) by mouth daily. 100 tablet 3   No current facility-administered medications on file prior to visit.    No Known  Allergies  Past Medical History:  Diagnosis Date   Hyperlipidemia    Hypertension    Osteoarthritis, multiple sites     Past Surgical History:  Procedure Laterality Date   APPENDECTOMY     CATARACT EXTRACTION W/ INTRAOCULAR LENS  IMPLANT, BILATERAL Bilateral 2012   KNEE ARTHROSCOPY Left 2017   TONSILLECTOMY      Family History  Problem Relation Age of Onset   Stroke Father    Hypertension Sister    Heart disease Sister        heart valve replaced   Dementia Sister    Hypertension Sister    Hypertension Sister    Dementia Sister    Diabetes Brother    Hypertension Brother    Diabetes Brother    Hypertension Brother    Cancer Maternal Grandmother        breast cancer    Social History   Socioeconomic History   Marital status: Divorced    Spouse name: Not on file   Number of children: 1   Years of education: Not on file   Highest education level: Not on file  Occupational History   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   Social Determinants of Health   Financial Resource Strain: Not on file  Food Insecurity: Not on file  Transportation Needs: Not on file  Physical Activity: Not on file  Stress: Not on file  Social Connections: Not on file  Intimate Partner Violence: Not on file   Review of Systems Seeps okay Appetite is okay Did lose about 20# from last year--being more careful with her eating Voids okay--nocturia x 2 is  stable No suspicious skin lesions--just "age" spots on his arms No heartburn or dysphagia. Has had more gas lately Bowels move fair--needs stool softeners as needed Has soreness when callous treated--better with new tennis shoes     Objective:   Physical Exam Constitutional:      Appearance: Normal appearance.  HENT:     Mouth/Throat:     Pharynx: No oropharyngeal exudate or posterior oropharyngeal erythema.  Eyes:     Conjunctiva/sclera: Conjunctivae normal.     Pupils: Pupils are equal, round, and reactive to light.  Cardiovascular:     Rate and Rhythm: Normal rate and regular rhythm.     Pulses: Normal pulses.     Heart sounds: No murmur heard.    No gallop.  Pulmonary:     Effort: Pulmonary effort is normal.     Breath sounds: Normal breath sounds. No wheezing or rales.  Abdominal:     Palpations: Abdomen is soft.     Tenderness: There is no abdominal tenderness.  Musculoskeletal:     Cervical back: Neck supple.     Right lower leg: No edema.     Left lower leg: No edema.  Lymphadenopathy:     Cervical: No cervical adenopathy.  Skin:    Findings: No rash.     Comments: Callous right plantar foot  Neurological:     General: No focal deficit present.     Mental Status: She is alert and oriented to person, place, and time.     Comments: Word naming-- 14/1 minute Recall 1/3  Psychiatric:        Mood and Affect: Mood normal.        Behavior: Behavior normal.            Assessment & Plan:

## 2022-03-31 NOTE — Assessment & Plan Note (Addendum)
I have personally reviewed the Medicare Annual Wellness questionnaire and have noted 1. The patient's medical and social history 2. Their use of alcohol, tobacco or illicit drugs 3. Their current medications and supplements 4. The patient's functional ability including ADL's, fall risks, home safety risks and hearing or visual             impairment. 5. Diet and physical activities 6. Evidence for depression or mood disorders  The patients weight, height, BMI and visual acuity have been recorded in the chart I have made referrals, counseling and provided education to the patient based review of the above and I have provided the pt with a written personalized care plan for preventive services.  I have provided you with a copy of your personalized plan for preventive services. Please take the time to review along with your updated medication list.   Done with cancer screening Discussed exercise Td/shingrix at the pharmacy Flu/RSV/COVID vaccines--in the fall Prevnar 20 here today

## 2022-04-01 LAB — CBC
HCT: 41.8 % (ref 36.0–46.0)
Hemoglobin: 14.1 g/dL (ref 12.0–15.0)
MCHC: 33.7 g/dL (ref 30.0–36.0)
MCV: 90.6 fl (ref 78.0–100.0)
Platelets: 171 10*3/uL (ref 150.0–400.0)
RBC: 4.61 Mil/uL (ref 3.87–5.11)
RDW: 12.8 % (ref 11.5–15.5)
WBC: 5.2 10*3/uL (ref 4.0–10.5)

## 2022-04-01 LAB — RENAL FUNCTION PANEL
Albumin: 4.2 g/dL (ref 3.5–5.2)
BUN: 15 mg/dL (ref 6–23)
CO2: 29 mEq/L (ref 19–32)
Calcium: 9.5 mg/dL (ref 8.4–10.5)
Chloride: 102 mEq/L (ref 96–112)
Creatinine, Ser: 1.04 mg/dL (ref 0.40–1.20)
GFR: 49.73 mL/min — ABNORMAL LOW (ref >60.00)
Glucose, Bld: 87 mg/dL (ref 70–99)
Phosphorus: 4.1 mg/dL (ref 2.3–4.6)
Potassium: 5 mEq/L (ref 3.5–5.1)
Sodium: 137 mEq/L (ref 135–145)

## 2022-04-01 LAB — HEPATIC FUNCTION PANEL
ALT: 23 U/L (ref 0–35)
AST: 24 U/L (ref 0–37)
Albumin: 4.2 g/dL (ref 3.5–5.2)
Alkaline Phosphatase: 69 U/L (ref 39–117)
Bilirubin, Direct: 0.1 mg/dL (ref 0.0–0.3)
Total Bilirubin: 0.5 mg/dL (ref 0.2–1.2)
Total Protein: 6.9 g/dL (ref 6.0–8.3)

## 2022-04-01 LAB — PARATHYROID HORMONE, INTACT (NO CA): PTH: 30 pg/mL (ref 16–77)

## 2022-04-01 LAB — TSH: TSH: 1.62 u[IU]/mL (ref 0.35–5.50)

## 2022-04-01 LAB — VITAMIN D 25 HYDROXY (VIT D DEFICIENCY, FRACTURES): VITD: 33.67 ng/mL (ref 30.00–100.00)

## 2022-04-09 ENCOUNTER — Encounter (HOSPITAL_COMMUNITY): Payer: Self-pay | Admitting: Emergency Medicine

## 2022-04-09 ENCOUNTER — Emergency Department (HOSPITAL_COMMUNITY)
Admission: EM | Admit: 2022-04-09 | Discharge: 2022-04-09 | Discharge status: Against Medical Advice | Payer: Medicare HMO | Attending: Emergency Medicine | Admitting: Emergency Medicine

## 2022-04-09 DIAGNOSIS — R030 Elevated blood-pressure reading, without diagnosis of hypertension: Secondary | ICD-10-CM | POA: Diagnosis not present

## 2022-04-09 DIAGNOSIS — Z5321 Procedure and treatment not carried out due to patient leaving prior to being seen by health care provider: Secondary | ICD-10-CM | POA: Insufficient documentation

## 2022-04-09 NOTE — ED Notes (Signed)
Patient left.

## 2022-04-09 NOTE — ED Triage Notes (Signed)
PT took BP at home and was elevated. Took meds today. Here it is  208/110. SHe states this is stress related. She has recently been moved into her son's home. Her granddaughter is there and her 4 kids. She states it is highly stressful environment

## 2022-04-11 ENCOUNTER — Telehealth: Payer: Self-pay | Admitting: Internal Medicine

## 2022-04-11 NOTE — Telephone Encounter (Signed)
Patient called our office and stated since her ed visit on 04/09/22 her blood pressure is good and lower than when she went in. Stated she feels much better. Informed that Dr. Alphonsus Sias would want to see her for a hospital follow up, she stated she did not want to come in for that appointment.

## 2022-04-11 NOTE — Telephone Encounter (Signed)
Spoke to pt. She will make an appt if her BP goes back up.

## 2022-04-14 ENCOUNTER — Telehealth: Payer: Self-pay

## 2022-04-14 NOTE — Telephone Encounter (Signed)
     Patient  visit on [facility]  4/3 Have you been able to follow up with your primary care physician? Yes    The patient was or was not able to obtain any needed medicine or equipment. Yes   Are there diet recommendations that you are having difficulty following? Na   Patient expresses understanding of discharge instructions and education provided has no other needs at this time.  Yes      Kathryn Lopez [facility], [facility] [phone] [address] [place], [place], [place] [address] Phone: [phone] Email: [name]

## 2022-05-01 ENCOUNTER — Encounter: Payer: Self-pay | Admitting: Internal Medicine

## 2022-05-01 ENCOUNTER — Ambulatory Visit (INDEPENDENT_AMBULATORY_CARE_PROVIDER_SITE_OTHER): Payer: Medicare HMO | Admitting: Internal Medicine

## 2022-05-01 VITALS — BP 138/76 | HR 60 | Temp 98.0°F | Ht 62.5 in | Wt 171.0 lb

## 2022-05-01 DIAGNOSIS — J0100 Acute maxillary sinusitis, unspecified: Secondary | ICD-10-CM | POA: Diagnosis not present

## 2022-05-01 DIAGNOSIS — J0110 Acute frontal sinusitis, unspecified: Secondary | ICD-10-CM | POA: Insufficient documentation

## 2022-05-01 MED ORDER — AMOXICILLIN 500 MG PO TABS: 1000.0000 mg | ORAL_TABLET | Freq: Two times a day (BID) | ORAL | 0 refills | Status: AC

## 2022-05-01 NOTE — Patient Instructions (Signed)
Stop the zyrtec and expectorant. I recommend a cough suppressant---with dextromethorphan (DM) Continue tyelnol

## 2022-05-01 NOTE — Assessment & Plan Note (Signed)
Discussed that this is not likely allergies Will give amoxil 1000 mg bid x 7 days Try DM cough med and tylenol--stop the allergy stuff

## 2022-05-01 NOTE — Progress Notes (Signed)
Subjective:    Patient ID: Kathryn Lopez, female    DOB: 05-06-1940, 82 y.o.   MRN: [id number]  HPI Here due to respiratory /allergy symptoms  Never been bothered much with pollen--but wonders if that is what is going on Sneezing and coughing---phlegm is now thick and greenish Started 4-5 days ago  Using zyrtec, cough drops and expectorant No fever Frontal headache Some sore throat Eyes itchy--using eye drops Left eye stuck with mucus this morning  Current Outpatient Medications on File Prior to Visit  Medication Sig Dispense Refill   acetaminophen (TYLENOL) 325 MG tablet Take 650 mg by mouth 3 (three) times daily as needed for mild pain.     cholecalciferol (VITAMIN D) 1000 UNITS tablet Take 1,000 Units by mouth daily.     losartan (COZAAR) 50 MG tablet Take 1 tablet (50 mg total) by mouth daily. 100 tablet 3   metoprolol succinate (TOPROL-XL) 50 MG 24 hr tablet Take 1 tablet (50 mg total) by mouth daily. Take with or immediately following a meal. 90 tablet 3   simvastatin (ZOCOR) 20 MG tablet Take 1 tablet (20 mg total) by mouth daily. 100 tablet 3   No current facility-administered medications on file prior to visit.    No Known Allergies  Past Medical History:  Diagnosis Date   Hyperlipidemia    Hypertension    Osteoarthritis, multiple sites     Past Surgical History:  Procedure Laterality Date   APPENDECTOMY     CATARACT EXTRACTION W/ INTRAOCULAR LENS  IMPLANT, BILATERAL Bilateral 2012   KNEE ARTHROSCOPY Left 2017   TONSILLECTOMY      Family History  Problem Relation Age of Onset   Stroke Father    Hypertension Sister    Heart disease Sister        heart valve replaced   Dementia Sister    Hypertension Sister    Hypertension Sister    Dementia Sister    Diabetes Brother    Hypertension Brother    Diabetes Brother    Hypertension Brother    Cancer Maternal Grandmother        breast cancer    Social History   Socioeconomic History    Marital status: Divorced    Spouse name: Not on file   Number of children: 1   Years of education: Not on file   Highest education level: Not on file  Occupational History   Occupation: Retail    Comment: Retired from [employer]    Employer: RETIRED  Tobacco Use   Smoking status: Never    Passive exposure: Never   Smokeless tobacco: Never  Substance and Sexual Activity   Alcohol use: No    Alcohol/week: 0.0 standard drinks of alcohol   Drug use: No   Sexual activity: Not on file  Other Topics Concern   Not on file  Social History Narrative   Widowed twice then divorced   One son      No living will   Would have son be health care POA if needed   Not sure about DNR-- very adamant about not being kept on life support   No feeding tube!!   Social Determinants of Health   Financial Resource Strain: Not on file  Food Insecurity: Not on file  Transportation Needs: Not on file  Physical Activity: Not on file  Stress: Not on file  Social Connections: Not on file  Intimate Partner Violence: Not on file    No N/V Eating  okay    Objective:   Physical Exam HENT:     Head:     Comments: Maxillary tenderness    Right Ear: Tympanic membrane and ear canal normal.     Left Ear: Tympanic membrane and ear canal normal.     Mouth/Throat:     Comments: Pharynx is red with exudates on right Pulmonary:     Effort: Pulmonary effort is normal.     Breath sounds: Normal breath sounds. No wheezing or rales.  Musculoskeletal:     Cervical back: Neck supple.  Lymphadenopathy:     Cervical: No cervical adenopathy.  Skin:    Comments: Scattered erythema and excoriation--mostly face/arms            Assessment & Plan:

## 2022-06-09 DIAGNOSIS — L84 Corns and callosities: Secondary | ICD-10-CM | POA: Diagnosis not present

## 2022-06-09 DIAGNOSIS — L603 Nail dystrophy: Secondary | ICD-10-CM | POA: Diagnosis not present

## 2022-06-09 DIAGNOSIS — I739 Peripheral vascular disease, unspecified: Secondary | ICD-10-CM | POA: Diagnosis not present

## 2022-06-23 ENCOUNTER — Telehealth: Payer: Self-pay | Admitting: Internal Medicine

## 2022-06-23 NOTE — Telephone Encounter (Signed)
Spoke to pt. Advised her that pneumonia is not usually contagious. She said they told her she could work. I advised that if they feel she is safe to work, then she is safe to be around her. I suggested if she is concerned, she could wear a mask around her daughter.

## 2022-06-23 NOTE — Telephone Encounter (Signed)
Patient called in and stated that her granddaughter has a touch of pneumonia in both lungs. She stated that they sent her back home and she wants to know what she should do. She stated that she is concerned that she may get it. Please advise. Thank you!

## 2022-08-21 ENCOUNTER — Encounter: Payer: Self-pay | Admitting: Internal Medicine

## 2022-08-21 ENCOUNTER — Ambulatory Visit (INDEPENDENT_AMBULATORY_CARE_PROVIDER_SITE_OTHER): Payer: Medicare HMO | Admitting: Internal Medicine

## 2022-08-21 VITALS — BP 138/88 | HR 54 | Temp 97.8°F | Ht 62.5 in | Wt 170.0 lb

## 2022-08-21 DIAGNOSIS — H1033 Unspecified acute conjunctivitis, bilateral: Secondary | ICD-10-CM | POA: Diagnosis not present

## 2022-08-21 MED ORDER — POLYMYXIN B-TRIMETHOPRIM 10000-0.1 UNIT/ML-% OP SOLN: 1.0000 [drp] | OPHTHALMIC | 1 refills | Status: DC

## 2022-08-21 NOTE — Progress Notes (Signed)
Subjective:    Patient ID: Kathryn Lopez, female    DOB: 10/04/1940, 82 y.o.   MRN: [id number]  HPI Here due to eye redness  Eyes are itching and burning Started 5 days ago--after great grandson threw dog's toy and hit her left eye Has stickiness in lids--stuck together---uses warm washcloth No change in vision  Current Outpatient Medications on File Prior to Visit  Medication Sig Dispense Refill   acetaminophen (TYLENOL) 325 MG tablet Take 650 mg by mouth 3 (three) times daily as needed for mild pain.     cholecalciferol (VITAMIN D) 1000 UNITS tablet Take 1,000 Units by mouth daily.     losartan (COZAAR) 50 MG tablet Take 1 tablet (50 mg total) by mouth daily. 100 tablet 3   metoprolol succinate (TOPROL-XL) 50 MG 24 hr tablet Take 1 tablet (50 mg total) by mouth daily. Take with or immediately following a meal. 90 tablet 3   simvastatin (ZOCOR) 20 MG tablet Take 1 tablet (20 mg total) by mouth daily. 100 tablet 3   No current facility-administered medications on file prior to visit.    No Known Allergies  Past Medical History:  Diagnosis Date   Hyperlipidemia    Hypertension    Osteoarthritis, multiple sites     Past Surgical History:  Procedure Laterality Date   APPENDECTOMY     CATARACT EXTRACTION W/ INTRAOCULAR LENS  IMPLANT, BILATERAL Bilateral 2012   KNEE ARTHROSCOPY Left 2017   TONSILLECTOMY      Family History  Problem Relation Age of Onset   Stroke Father    Hypertension Sister    Heart disease Sister        heart valve replaced   Dementia Sister    Hypertension Sister    Hypertension Sister    Dementia Sister    Diabetes Brother    Hypertension Brother    Diabetes Brother    Hypertension Brother    Cancer Maternal Grandmother        breast cancer    Social History   Socioeconomic History   Marital status: Divorced    Spouse name: Not on file   Number of children: 1   Years of education: Not on file   Highest education level: Not on  file  Occupational History   Occupation: Retail    Comment: Retired from [employer]    Employer: RETIRED  Tobacco Use   Smoking status: Never    Passive exposure: Never   Smokeless tobacco: Never  Substance and Sexual Activity   Alcohol use: No    Alcohol/week: 0.0 standard drinks of alcohol   Drug use: No   Sexual activity: Not on file  Other Topics Concern   Not on file  Social History Narrative   Widowed twice then divorced   One son      No living will   Would have son be health care POA if needed   Not sure about DNR-- very adamant about not being kept on life support   No feeding tube!!   Social Determinants of Health   Financial Resource Strain: Not on file  Food Insecurity: Not on file  Transportation Needs: Not on file  Physical Activity: Not on file  Stress: Not on file  Social Connections: Not on file  Intimate Partner Violence: Not on file   Review of Systems Has some facial redness that is not new No fever No cough or respiratory symptoms---other than chronic runny nose  Objective:   Physical Exam Constitutional:      Appearance: Normal appearance.  Eyes:     Comments: Marked conjunctival injection bilateral with some edema--esp on right  Neurological:     Mental Status: She is alert.            Assessment & Plan:

## 2022-08-21 NOTE — Assessment & Plan Note (Signed)
No vision changes No viral symptoms Will treat with polytrim for 5 days If not improving, to ophthal

## 2022-08-26 DIAGNOSIS — I129 Hypertensive chronic kidney disease with stage 1 through stage 4 chronic kidney disease, or unspecified chronic kidney disease: Secondary | ICD-10-CM | POA: Diagnosis not present

## 2022-08-26 DIAGNOSIS — N2581 Secondary hyperparathyroidism of renal origin: Secondary | ICD-10-CM | POA: Diagnosis not present

## 2022-08-26 DIAGNOSIS — N189 Chronic kidney disease, unspecified: Secondary | ICD-10-CM | POA: Diagnosis not present

## 2022-08-26 DIAGNOSIS — N1832 Chronic kidney disease, stage 3b: Secondary | ICD-10-CM | POA: Diagnosis not present

## 2022-08-26 DIAGNOSIS — D631 Anemia in chronic kidney disease: Secondary | ICD-10-CM | POA: Diagnosis not present

## 2022-09-09 DIAGNOSIS — L84 Corns and callosities: Secondary | ICD-10-CM | POA: Diagnosis not present

## 2022-09-09 DIAGNOSIS — I739 Peripheral vascular disease, unspecified: Secondary | ICD-10-CM | POA: Diagnosis not present

## 2022-09-09 DIAGNOSIS — L603 Nail dystrophy: Secondary | ICD-10-CM | POA: Diagnosis not present

## 2022-11-06 ENCOUNTER — Encounter: Payer: Self-pay | Admitting: Internal Medicine

## 2022-11-06 ENCOUNTER — Ambulatory Visit (INDEPENDENT_AMBULATORY_CARE_PROVIDER_SITE_OTHER): Payer: Medicare HMO | Admitting: Internal Medicine

## 2022-11-06 VITALS — BP 122/76 | HR 54 | Temp 97.8°F | Ht 62.5 in | Wt 169.0 lb

## 2022-11-06 DIAGNOSIS — J0110 Acute frontal sinusitis, unspecified: Secondary | ICD-10-CM

## 2022-11-06 MED ORDER — BENZONATATE 200 MG PO CAPS: 200.0000 mg | ORAL_CAPSULE | Freq: Three times a day (TID) | ORAL | 0 refills | Status: DC | PRN

## 2022-11-06 MED ORDER — AMOXICILLIN 500 MG PO TABS: 1000.0000 mg | ORAL_TABLET | Freq: Two times a day (BID) | ORAL | 0 refills | Status: AC

## 2022-11-06 NOTE — Progress Notes (Signed)
Subjective:    Patient ID: Kathryn Lopez, female    DOB: July 13, 1940, 82 y.o.   MRN: [id number]  HPI Here due to cough  No fever Doesn't feel bad but coughing bad for over a week Robitussin helps a little Lots of phlegm with the cough--and some drainage No SOB Slight temporal headache Ears "are closing up"---hears echo No sore throat  Current Outpatient Medications on File Prior to Visit  Medication Sig Dispense Refill   acetaminophen (TYLENOL) 325 MG tablet Take 650 mg by mouth 3 (three) times daily as needed for mild pain.     cholecalciferol (VITAMIN D) 1000 UNITS tablet Take 1,000 Units by mouth daily.     losartan (COZAAR) 50 MG tablet Take 1 tablet (50 mg total) by mouth daily. 100 tablet 3   metoprolol succinate (TOPROL-XL) 50 MG 24 hr tablet Take 1 tablet (50 mg total) by mouth daily. Take with or immediately following a meal. 90 tablet 3   simvastatin (ZOCOR) 20 MG tablet Take 1 tablet (20 mg total) by mouth daily. 100 tablet 3   trimethoprim-polymyxin b (POLYTRIM) ophthalmic solution Place 1 drop into both eyes every 4 (four) hours. 10 mL 1   No current facility-administered medications on file prior to visit.    No Known Allergies  Past Medical History:  Diagnosis Date   Hyperlipidemia    Hypertension    Osteoarthritis, multiple sites     Past Surgical History:  Procedure Laterality Date   APPENDECTOMY     CATARACT EXTRACTION W/ INTRAOCULAR LENS  IMPLANT, BILATERAL Bilateral 2012   KNEE ARTHROSCOPY Left 2017   TONSILLECTOMY      Family History  Problem Relation Age of Onset   Stroke Father    Hypertension Sister    Heart disease Sister        heart valve replaced   Dementia Sister    Hypertension Sister    Hypertension Sister    Dementia Sister    Diabetes Brother    Hypertension Brother    Diabetes Brother    Hypertension Brother    Cancer Maternal Grandmother        breast cancer    Social History   Socioeconomic History   Marital  status: Divorced    Spouse name: Not on file   Number of children: 1   Years of education: Not on file   Highest education level: Not on file  Occupational History   Occupation: Retail    Comment: Retired from [employer]    Employer: RETIRED  Tobacco Use   Smoking status: Never    Passive exposure: Never   Smokeless tobacco: Never  Substance and Sexual Activity   Alcohol use: No    Alcohol/week: 0.0 standard drinks of alcohol   Drug use: No   Sexual activity: Not on file  Other Topics Concern   Not on file  Social History Narrative   Widowed twice then divorced   One son      No living will   Would have son be health care POA if needed   Not sure about DNR-- very adamant about not being kept on life support   No feeding tube!!   Social Determinants of Health   Financial Resource Strain: Not on file  Food Insecurity: Not on file  Transportation Needs: Not on file  Physical Activity: Not on file  Stress: Not on file  Social Connections: Not on file  Intimate Partner Violence: Not on file  Review of Systems No loss of smell or taste No N/V Eating okay     Objective:   Physical Exam Constitutional:      Appearance: Normal appearance.  HENT:     Head:     Comments: Mild right frontal tenderness    Right Ear: Tympanic membrane and ear canal normal.     Left Ear: Tympanic membrane and ear canal normal.     Mouth/Throat:     Pharynx: No oropharyngeal exudate or posterior oropharyngeal erythema.  Pulmonary:     Effort: Pulmonary effort is normal.     Breath sounds: Normal breath sounds. No wheezing or rales.  Musculoskeletal:     Cervical back: Neck supple.  Lymphadenopathy:     Cervical: No cervical adenopathy.  Neurological:     Mental Status: She is alert.            Assessment & Plan:

## 2022-11-06 NOTE — Assessment & Plan Note (Signed)
Will try amoxil 1000 bid x 7 days again Benzonatate for the cough

## 2022-11-13 ENCOUNTER — Telehealth: Payer: Self-pay | Admitting: Internal Medicine

## 2022-11-13 NOTE — Telephone Encounter (Signed)
Spoke to pt

## 2022-11-13 NOTE — Telephone Encounter (Signed)
Pt called in and stated that she wanted  Dr Renford Dills  to know that she is feeling a li  better but her ears still hurting pt would like a call back if possible

## 2022-11-13 NOTE — Telephone Encounter (Signed)
Called and spoke to pt. She said she is not having any sinus issues. Just sounds like she is talking in a tunnel. She did say she still has a cough.

## 2022-12-09 DIAGNOSIS — H35033 Hypertensive retinopathy, bilateral: Secondary | ICD-10-CM | POA: Diagnosis not present

## 2022-12-09 DIAGNOSIS — H524 Presbyopia: Secondary | ICD-10-CM | POA: Diagnosis not present

## 2022-12-09 DIAGNOSIS — H25049 Posterior subcapsular polar age-related cataract, unspecified eye: Secondary | ICD-10-CM | POA: Diagnosis not present

## 2022-12-10 DIAGNOSIS — L84 Corns and callosities: Secondary | ICD-10-CM | POA: Diagnosis not present

## 2022-12-10 DIAGNOSIS — Z0100 Encounter for examination of eyes and vision without abnormal findings: Secondary | ICD-10-CM | POA: Diagnosis not present

## 2022-12-10 DIAGNOSIS — I739 Peripheral vascular disease, unspecified: Secondary | ICD-10-CM | POA: Diagnosis not present

## 2022-12-10 DIAGNOSIS — L603 Nail dystrophy: Secondary | ICD-10-CM | POA: Diagnosis not present

## 2023-01-09 ENCOUNTER — Other Ambulatory Visit: Payer: Self-pay | Admitting: Internal Medicine

## 2023-02-16 ENCOUNTER — Other Ambulatory Visit: Payer: Self-pay | Admitting: Internal Medicine

## 2023-03-11 DIAGNOSIS — L84 Corns and callosities: Secondary | ICD-10-CM | POA: Diagnosis not present

## 2023-03-11 DIAGNOSIS — L603 Nail dystrophy: Secondary | ICD-10-CM | POA: Diagnosis not present

## 2023-03-11 DIAGNOSIS — I739 Peripheral vascular disease, unspecified: Secondary | ICD-10-CM | POA: Diagnosis not present

## 2023-04-02 ENCOUNTER — Encounter: Payer: Self-pay | Admitting: Internal Medicine

## 2023-04-02 ENCOUNTER — Ambulatory Visit: Payer: Medicare HMO | Admitting: Internal Medicine

## 2023-04-02 VITALS — BP 130/84 | HR 57 | Temp 97.7°F | Ht 61.5 in | Wt 170.0 lb

## 2023-04-02 DIAGNOSIS — N1831 Chronic kidney disease, stage 3a: Secondary | ICD-10-CM | POA: Diagnosis not present

## 2023-04-02 DIAGNOSIS — Z0000 Encounter for general adult medical examination without abnormal findings: Secondary | ICD-10-CM

## 2023-04-02 DIAGNOSIS — F39 Unspecified mood [affective] disorder: Secondary | ICD-10-CM

## 2023-04-02 DIAGNOSIS — M150 Primary generalized (osteo)arthritis: Secondary | ICD-10-CM

## 2023-04-02 DIAGNOSIS — I10 Essential (primary) hypertension: Secondary | ICD-10-CM | POA: Diagnosis not present

## 2023-04-02 NOTE — Patient Instructions (Addendum)
 You can try over the counter cetirizine 10mg  daily for the allergy symptoms. I would recommend a tetanus booster at the pharmacy. You should also get updated flu and COVID vaccines in the fall---as well as the one time RSV vaccine (by the fall)

## 2023-04-02 NOTE — Assessment & Plan Note (Signed)
 BP Readings from Last 3 Encounters:  04/02/23 130/84  11/06/22 122/76  08/21/22 138/88   Good control on losartan 50, metoprolol 50 daily

## 2023-04-02 NOTE — Progress Notes (Signed)
 Hearing Screening - Comments:: Passed whisper test Vision Screening - Comments:: February 2025

## 2023-04-02 NOTE — Assessment & Plan Note (Signed)
 Still adjusting to have to leave her home No MDD Still okay without meds

## 2023-04-02 NOTE — Assessment & Plan Note (Signed)
 Has been stable on the losartan Will check labs

## 2023-04-02 NOTE — Assessment & Plan Note (Signed)
 I have personally reviewed the Medicare Annual Wellness questionnaire and have noted 1. The patient's medical and social history 2. Their use of alcohol, tobacco or illicit drugs 3. Their current medications and supplements 4. The patient's functional ability including ADL's, fall risks, home safety risks and hearing or visual             impairment. 5. Diet and physical activities 6. Evidence for depression or mood disorders  The patients weight, height, BMI and visual acuity have been recorded in the chart I have made referrals, counseling and provided education to the patient based review of the above and I have provided the pt with a written personalized care plan for preventive services.  I have provided you with a copy of your personalized plan for preventive services. Please take the time to review along with your updated medication list.  Done with cancer screening Discussed increasing exercise Recommended flu/COVID and RSV before next fall season Td booster

## 2023-04-02 NOTE — Progress Notes (Signed)
 Subjective:    Patient ID: Kathryn Lopez, female    DOB: 1940-01-22, 83 y.o.   MRN: [id number]  HPI Here for Medicare wellness visit and follow up of chronic health conditions Reviewed advanced directives Reviewed other doctors--[facility], Dr Fransisco Hertz, Dr Ardeen Jourdain No hospitalizations or surgery in the past year Has pedal machine--but not walking much anymore Vision is fine--new glasses Hearing is good No alcohol or tobacco No falls Some recall issues--but no functional memory issues  GFR low for some time Had acute time as low as 32--but mostly in 45-55 range  Still living with son--adjusting to this Doesn't drive Son does shopping--and most of the housework (she helps with dishes and in kitchen) No assistance needed for ADLs 3 great grandkids are with them--stress but does enjoy them No regular depression -but some degree of anhedonia  No chest pain or SOB Some cough in the past few days--lots of phlegm. ??allergies Eyes watering now No dizziness or syncope No edema No palpitations  Current Outpatient Medications on File Prior to Visit  Medication Sig Dispense Refill   acetaminophen (TYLENOL) 325 MG tablet Take 650 mg by mouth 3 (three) times daily as needed for mild pain.     cholecalciferol (VITAMIN D) 1000 UNITS tablet Take 1,000 Units by mouth daily.     losartan (COZAAR) 50 MG tablet TAKE 1 TABLET EVERY DAY 90 tablet 3   metoprolol succinate (TOPROL-XL) 50 MG 24 hr tablet TAKE 1 TABLET EVERY DAY . TAKE WITH OR IMMEDIATELY FOLLOWING A MEAL. (REPLACES METOPROLOL TARTRATE) 90 tablet 3   simvastatin (ZOCOR) 20 MG tablet TAKE 1 TABLET EVERY DAY 90 tablet 3   No current facility-administered medications on file prior to visit.    No Known Allergies  Past Medical History:  Diagnosis Date   Hyperlipidemia    Hypertension    Osteoarthritis, multiple sites     Past Surgical History:  Procedure Laterality Date   APPENDECTOMY     CATARACT  EXTRACTION W/ INTRAOCULAR LENS  IMPLANT, BILATERAL Bilateral 2012   KNEE ARTHROSCOPY Left 2017   TONSILLECTOMY      Family History  Problem Relation Age of Onset   Stroke Father    Hypertension Sister    Heart disease Sister        heart valve replaced   Dementia Sister    Hypertension Sister    Hypertension Sister    Dementia Sister    Diabetes Brother    Hypertension Brother    Diabetes Brother    Hypertension Brother    Cancer Maternal Grandmother        breast cancer    Social History   Socioeconomic History   Marital status: Divorced    Spouse name: Not on file   Number of children: 1   Years of education: Not on file   Highest education level: Not on file  Occupational History   Occupation: Retail    Comment: Retired from [employer]    Employer: RETIRED  Tobacco Use   Smoking status: Never    Passive exposure: Never   Smokeless tobacco: Never  Substance and Sexual Activity   Alcohol use: No    Alcohol/week: 0.0 standard drinks of alcohol   Drug use: No   Sexual activity: Not on file  Other Topics Concern   Not on file  Social History Narrative   Widowed twice then divorced   One son      No living will   Would have son  be health care POA if needed   Not sure about DNR-- very adamant about not being kept on life support   No feeding tube!!   Social Drivers of Health   Financial Resource Strain: Not on file  Food Insecurity: Not on file  Transportation Needs: Not on file  Physical Activity: Not on file  Stress: Not on file  Social Connections: Not on file  Intimate Partner Violence: Not on file   Review of Systems Appetite is good Weight is stable--kept off the 20# she lost Sleeps okay--nocturia x 2 No daytime trouble voiding---just some urgency Wears seat belt  No heartburn or dysphagia Bowels move slow---uses stool softeners prn Has elevated lesion on left forearm Some "tightness" in back, knees and ankles---tylenol prn    Objective:    Physical Exam Constitutional:      Appearance: Normal appearance.  HENT:     Mouth/Throat:     Pharynx: No oropharyngeal exudate or posterior oropharyngeal erythema.  Eyes:     Conjunctiva/sclera: Conjunctivae normal.     Pupils: Pupils are equal, round, and reactive to light.  Cardiovascular:     Rate and Rhythm: Normal rate and regular rhythm.     Pulses: Normal pulses.     Heart sounds:     No gallop.     Comments: Soft systolic murmur Pulmonary:     Effort: Pulmonary effort is normal.     Breath sounds: Normal breath sounds. No wheezing or rales.  Abdominal:     Palpations: Abdomen is soft.     Tenderness: There is no abdominal tenderness.  Musculoskeletal:     Cervical back: Neck supple.     Right lower leg: No edema.     Left lower leg: No edema.  Lymphadenopathy:     Cervical: No cervical adenopathy.  Skin:    Findings: No rash.     Comments: 6-38mm raised cutaneous horn on left forearm (discussed trying OTC wart freeze Rx)  Neurological:     General: No focal deficit present.     Mental Status: She is alert and oriented to person, place, and time.     Comments: Mini-cog normal  Psychiatric:        Mood and Affect: Mood normal.        Behavior: Behavior normal.            Assessment & Plan:

## 2023-04-02 NOTE — Assessment & Plan Note (Signed)
 Discussed using the tylenol more often

## 2023-04-03 LAB — LIPID PANEL
Cholesterol: 143 mg/dL (ref 0–200)
HDL: 48.3 mg/dL (ref >39.00)
LDL Cholesterol: 76 mg/dL (ref 0–99)
NonHDL: 95.09
Total CHOL/HDL Ratio: 3
Triglycerides: 94 mg/dL (ref 0.0–149.0)
VLDL: 18.8 mg/dL (ref 0.0–40.0)

## 2023-04-03 LAB — CBC
HCT: 40.7 % (ref 36.0–46.0)
Hemoglobin: 13.5 g/dL (ref 12.0–15.0)
MCHC: 33 g/dL (ref 30.0–36.0)
MCV: 91.3 fl (ref 78.0–100.0)
Platelets: 220 10*3/uL (ref 150.0–400.0)
RBC: 4.46 Mil/uL (ref 3.87–5.11)
RDW: 12.8 % (ref 11.5–15.5)
WBC: 8.5 10*3/uL (ref 4.0–10.5)

## 2023-04-03 LAB — VITAMIN D 25 HYDROXY (VIT D DEFICIENCY, FRACTURES): VITD: 34.44 ng/mL (ref 30.00–100.00)

## 2023-04-03 LAB — RENAL FUNCTION PANEL
Albumin: 4.1 g/dL (ref 3.5–5.2)
BUN: 17 mg/dL (ref 6–23)
CO2: 30 meq/L (ref 19–32)
Calcium: 8.9 mg/dL (ref 8.4–10.5)
Chloride: 101 meq/L (ref 96–112)
Creatinine, Ser: 0.93 mg/dL (ref 0.40–1.20)
GFR: 56.47 mL/min — ABNORMAL LOW (ref >60.00)
Glucose, Bld: 78 mg/dL (ref 70–99)
Phosphorus: 3 mg/dL (ref 2.3–4.6)
Potassium: 4.3 meq/L (ref 3.5–5.1)
Sodium: 138 meq/L (ref 135–145)

## 2023-04-03 LAB — HEPATIC FUNCTION PANEL
ALT: 20 U/L (ref 0–35)
AST: 22 U/L (ref 0–37)
Albumin: 4.1 g/dL (ref 3.5–5.2)
Alkaline Phosphatase: 69 U/L (ref 39–117)
Bilirubin, Direct: 0.1 mg/dL (ref 0.0–0.3)
Total Bilirubin: 0.4 mg/dL (ref 0.2–1.2)
Total Protein: 7 g/dL (ref 6.0–8.3)

## 2023-04-03 LAB — PARATHYROID HORMONE, INTACT (NO CA): PTH: 12 pg/mL — ABNORMAL LOW (ref 16–77)

## 2023-04-03 LAB — TSH: TSH: 1.18 u[IU]/mL (ref 0.35–5.50)

## 2023-04-16 ENCOUNTER — Telehealth: Payer: Self-pay

## 2023-04-16 NOTE — Telephone Encounter (Signed)
 Looks like she is already scheduled with Bufford Lope in July.   Since Dr Alphonsus Sias is retiring, approval from him is not needed to transfer.

## 2023-04-16 NOTE — Telephone Encounter (Signed)
 Copied from CRM [id number]. Topic: Appointments - Transfer of Care >> Apr 16, 2023 11:54 AM Ann Held wrote: Pt is requesting to transfer FROM: Dr.Letvak - [place] Pt is requesting to transfer TO: Moshe Cipro Reason for requested transfer: Dr is leaving the office  It is the responsibility of the team the patient would like to transfer to (NP Ashley Royalty) to reach out to the patient if for any reason this transfer is not acceptable.

## 2023-06-12 ENCOUNTER — Ambulatory Visit: Admitting: Podiatry

## 2023-06-24 ENCOUNTER — Ambulatory Visit: Admitting: Podiatry

## 2023-06-24 ENCOUNTER — Encounter: Payer: Self-pay | Admitting: Podiatry

## 2023-06-24 DIAGNOSIS — M79671 Pain in right foot: Secondary | ICD-10-CM | POA: Diagnosis not present

## 2023-06-24 DIAGNOSIS — B351 Tinea unguium: Secondary | ICD-10-CM

## 2023-06-24 DIAGNOSIS — M79672 Pain in left foot: Secondary | ICD-10-CM | POA: Diagnosis not present

## 2023-06-24 NOTE — Progress Notes (Signed)
 Patient presents for evaluation and treatment of tenderness and some redness around nails feet.  Tenderness around toes with walking and wearing shoes.  Physical exam:  General appearance: Alert, pleasant, and in no acute distress.  Vascular: Pedal pulses: DP 2/4 B/L, PT 0/4 B/L. miold edema lower legs bilaterally  Neurological:    Dermatologic:  Nails thickened, disfigured, discolored 1-5 BL with subungual debris.  Redness and hypertrophic nail folds along nail folds bilaterally but no signs of drainage or infection.  Musculoskeletal:     Diagnosis: 1. Painful onychomycotic nails 1 through 5 bilaterally. 2. Pain toes 1 through 5 bilaterally.  Plan: Debrided onychomycotic nails 1 through 5 bilaterally.  Return 3 months

## 2023-07-20 NOTE — Progress Notes (Signed)
   Acute Office Visit  Subjective:     Patient ID: Kathryn Lopez, female    DOB: Dec 06, 1940, 83 y.o.   MRN: [id number]  No chief complaint on file.   HPI  Discussed the use of AI scribe software for clinical note transcription with the patient, who gave verbal consent to proceed.  History of Present Illness      ROS Per HPI      Objective:    There were no vitals taken for this visit.   Physical Exam Vitals and nursing note reviewed.  Constitutional:      General: She is not in acute distress.    Appearance: Normal appearance. She is normal weight.  HENT:     Head: Normocephalic and atraumatic.     Right Ear: External ear normal.     Left Ear: External ear normal.     Nose: Nose normal.     Mouth/Throat:     Mouth: Mucous membranes are moist.     Pharynx: Oropharynx is clear.  Eyes:     Extraocular Movements: Extraocular movements intact.     Pupils: Pupils are equal, round, and reactive to light.  Cardiovascular:     Rate and Rhythm: Normal rate and regular rhythm.     Pulses: Normal pulses.     Heart sounds: Normal heart sounds.  Pulmonary:     Effort: Pulmonary effort is normal. No respiratory distress.     Breath sounds: Normal breath sounds. No wheezing, rhonchi or rales.  Musculoskeletal:        General: Normal range of motion.     Cervical back: Normal range of motion.     Right lower leg: No edema.     Left lower leg: No edema.  Lymphadenopathy:     Cervical: No cervical adenopathy.  Neurological:     General: No focal deficit present.     Mental Status: She is alert and oriented to person, place, and time.  Psychiatric:        Mood and Affect: Mood normal.        Thought Content: Thought content normal.    No results found for any visits on 07/21/23.      Assessment & Plan:   Assessment and Plan Assessment & Plan       No orders of the defined types were placed in this encounter.   No follow-ups on file.  Corean LITTIE Ku, FNP

## 2023-07-20 NOTE — Patient Instructions (Incomplete)
 Welcome to [facility]!  Thank you for choosing us  for your Primary Care needs.   We offer in person and video appointments for your convenience. You may call our office to schedule appointments, or you may schedule appointments with me through MyChart.   The best way to get in contact with me is via MyChart message. This will get to me faster than a phone call, unless there is an emergency, then please call 911.  The lab is located downstairs in the Sports Medicine building, we also have xray available there.   Follow up with me in March for annual visit, sooner if needed.

## 2023-07-21 ENCOUNTER — Encounter: Payer: Self-pay | Admitting: Family Medicine

## 2023-07-21 ENCOUNTER — Ambulatory Visit: Admitting: Family Medicine

## 2023-07-21 VITALS — BP 160/80 | HR 75 | Temp 98.5°F | Ht 61.5 in | Wt 172.8 lb

## 2023-07-21 DIAGNOSIS — N1831 Chronic kidney disease, stage 3a: Secondary | ICD-10-CM

## 2023-07-21 DIAGNOSIS — I10 Essential (primary) hypertension: Secondary | ICD-10-CM | POA: Diagnosis not present

## 2023-07-21 DIAGNOSIS — M150 Primary generalized (osteo)arthritis: Secondary | ICD-10-CM

## 2023-07-21 DIAGNOSIS — F411 Generalized anxiety disorder: Secondary | ICD-10-CM

## 2023-07-21 DIAGNOSIS — Z8619 Personal history of other infectious and parasitic diseases: Secondary | ICD-10-CM | POA: Insufficient documentation

## 2023-07-21 DIAGNOSIS — R011 Cardiac murmur, unspecified: Secondary | ICD-10-CM | POA: Diagnosis not present

## 2023-07-21 DIAGNOSIS — Z6832 Body mass index (BMI) 32.0-32.9, adult: Secondary | ICD-10-CM

## 2023-07-21 DIAGNOSIS — E785 Hyperlipidemia, unspecified: Secondary | ICD-10-CM

## 2023-08-04 DIAGNOSIS — N1832 Chronic kidney disease, stage 3b: Secondary | ICD-10-CM | POA: Diagnosis not present

## 2023-08-13 DIAGNOSIS — N2581 Secondary hyperparathyroidism of renal origin: Secondary | ICD-10-CM | POA: Diagnosis not present

## 2023-08-13 DIAGNOSIS — N1831 Chronic kidney disease, stage 3a: Secondary | ICD-10-CM | POA: Diagnosis not present

## 2023-08-13 DIAGNOSIS — E785 Hyperlipidemia, unspecified: Secondary | ICD-10-CM | POA: Diagnosis not present

## 2023-08-13 DIAGNOSIS — D631 Anemia in chronic kidney disease: Secondary | ICD-10-CM | POA: Diagnosis not present

## 2023-08-13 DIAGNOSIS — I129 Hypertensive chronic kidney disease with stage 1 through stage 4 chronic kidney disease, or unspecified chronic kidney disease: Secondary | ICD-10-CM | POA: Diagnosis not present

## 2023-09-23 ENCOUNTER — Encounter: Payer: Self-pay | Admitting: Podiatry

## 2023-09-23 ENCOUNTER — Ambulatory Visit: Admitting: Podiatry

## 2023-09-23 DIAGNOSIS — B351 Tinea unguium: Secondary | ICD-10-CM | POA: Diagnosis not present

## 2023-09-23 DIAGNOSIS — M79672 Pain in left foot: Secondary | ICD-10-CM | POA: Diagnosis not present

## 2023-09-23 DIAGNOSIS — M79671 Pain in right foot: Secondary | ICD-10-CM

## 2023-09-23 NOTE — Progress Notes (Signed)
 Patient presents for evaluation and treatment of tenderness and some redness around nails feet.  Tenderness around toes with walking and wearing shoes.  Physical exam:  General appearance: Alert, pleasant, and in no acute distress.  Vascular: Pedal pulses: DP 2/4 B/L, PT 0/4 B/L. Mild edema lower legs bilaterally  Neu  Dermatologic:  Nails thickened, disfigured, discolored 1-5 BL with subungual debris.  Redness and hypertrophic nail folds along nail folds bilaterally but no signs of drainage or infection.  Musculoskeletal:     Diagnosis: 1. Painful onychomycotic nails 1 through 5 bilaterally. 2. Pain toes 1 through 5 bilaterally.  Plan: -Debrided onychomycotic nails 1 through 5 bilaterally.  Sharply debrided nails with nail clipper and reduced with a power bur.  Return 3 months RFC

## 2023-10-22 DIAGNOSIS — E785 Hyperlipidemia, unspecified: Secondary | ICD-10-CM | POA: Diagnosis not present

## 2023-10-22 DIAGNOSIS — K5900 Constipation, unspecified: Secondary | ICD-10-CM | POA: Diagnosis not present

## 2023-10-22 DIAGNOSIS — Z8249 Family history of ischemic heart disease and other diseases of the circulatory system: Secondary | ICD-10-CM | POA: Diagnosis not present

## 2023-10-22 DIAGNOSIS — Z833 Family history of diabetes mellitus: Secondary | ICD-10-CM | POA: Diagnosis not present

## 2023-10-22 DIAGNOSIS — I10 Essential (primary) hypertension: Secondary | ICD-10-CM | POA: Diagnosis not present

## 2023-10-22 DIAGNOSIS — I739 Peripheral vascular disease, unspecified: Secondary | ICD-10-CM | POA: Diagnosis not present

## 2023-10-22 DIAGNOSIS — Z9181 History of falling: Secondary | ICD-10-CM | POA: Diagnosis not present

## 2023-10-22 DIAGNOSIS — M1990 Unspecified osteoarthritis, unspecified site: Secondary | ICD-10-CM | POA: Diagnosis not present

## 2023-10-22 DIAGNOSIS — R011 Cardiac murmur, unspecified: Secondary | ICD-10-CM | POA: Diagnosis not present

## 2023-12-23 ENCOUNTER — Ambulatory Visit: Admitting: Podiatry

## 2023-12-23 ENCOUNTER — Telehealth: Payer: Self-pay

## 2023-12-23 NOTE — Telephone Encounter (Signed)
 Patient's son left a message on nurse triage line at 10:19 am to cancel patient's 10:15 appointment due to patient not feeling well. He would like to reschedule appointment.

## 2024-01-14 ENCOUNTER — Telehealth: Payer: Self-pay

## 2024-01-14 NOTE — Telephone Encounter (Signed)
 Patient's son called and needs to reschedule appointment that was cancelled in December.

## 2024-01-21 ENCOUNTER — Encounter: Payer: Self-pay | Admitting: Family Medicine

## 2024-01-21 ENCOUNTER — Ambulatory Visit: Admitting: Family Medicine

## 2024-01-21 VITALS — BP 138/84 | HR 67 | Temp 98.2°F | Ht 61.5 in | Wt 180.0 lb

## 2024-01-21 DIAGNOSIS — E785 Hyperlipidemia, unspecified: Secondary | ICD-10-CM

## 2024-01-21 DIAGNOSIS — L989 Disorder of the skin and subcutaneous tissue, unspecified: Secondary | ICD-10-CM

## 2024-01-21 DIAGNOSIS — N1831 Chronic kidney disease, stage 3a: Secondary | ICD-10-CM

## 2024-01-21 DIAGNOSIS — R011 Cardiac murmur, unspecified: Secondary | ICD-10-CM

## 2024-01-21 DIAGNOSIS — R7989 Other specified abnormal findings of blood chemistry: Secondary | ICD-10-CM

## 2024-01-21 DIAGNOSIS — I10 Essential (primary) hypertension: Secondary | ICD-10-CM | POA: Diagnosis not present

## 2024-01-21 DIAGNOSIS — R413 Other amnesia: Secondary | ICD-10-CM | POA: Diagnosis not present

## 2024-01-21 DIAGNOSIS — E559 Vitamin D deficiency, unspecified: Secondary | ICD-10-CM

## 2024-01-21 DIAGNOSIS — F411 Generalized anxiety disorder: Secondary | ICD-10-CM

## 2024-01-21 MED ORDER — SIMVASTATIN 20 MG PO TABS: 20.0000 mg | ORAL_TABLET | Freq: Every day | ORAL | 3 refills | Status: AC

## 2024-01-21 MED ORDER — METOPROLOL SUCCINATE ER 50 MG PO TB24: 50.0000 mg | ORAL_TABLET | Freq: Every day | ORAL | 3 refills | Status: AC

## 2024-01-21 MED ORDER — LOSARTAN POTASSIUM 50 MG PO TABS: 50.0000 mg | ORAL_TABLET | Freq: Every day | ORAL | 3 refills | Status: AC

## 2024-01-21 NOTE — Progress Notes (Signed)
 "  Established Patient Office Visit  Subjective:     Patient ID: Kathryn Lopez, female    DOB: 02/24/1940, 84 y.o.   MRN: [id number]  Chief Complaint  Patient presents with   Medical Management of Chronic Issues    HPI  Discussed the use of AI scribe software for clinical note transcription with the patient, who gave verbal consent to proceed.  History of Present Illness Kathryn Lopez is an 84 year old female who presents with concerns about a lesion on her arm and foot pain.  Cutaneous lesion on arm - Lesion present on arm, location previously affected by shingles over two years ago - Avoids touching the lesion due to fear of bleeding - Uncertain if lesion was previously evaluated by dermatologist  Cutaneous lesion on foot - New lesion on foot appeared a few days after podiatry visit - Lesion itself is not painful - Applies Neosporin and peroxide to maintain cleanliness  Toe pain - Toe pain attributed to pressure from tight shoes  Nasal lesion - Spot on nose has improved with Neosporin and ointment  Memory impairment - Perceives decline in memory - No family history of similar memory issues     ROS Per HPI      Objective:    BP 138/84   Pulse 67   Temp 98.2 F (36.8 C) (Temporal)   Ht 5' 1.5 (1.562 m)   Wt 180 lb (81.6 kg)   SpO2 96%   BMI 33.46 kg/m    Physical Exam Vitals and nursing note reviewed.  Constitutional:      General: She is not in acute distress.    Appearance: Normal appearance. She is obese.  HENT:     Head: Normocephalic and atraumatic.     Right Ear: External ear normal.     Left Ear: External ear normal.     Nose: Nose normal.     Mouth/Throat:     Mouth: Mucous membranes are moist.     Pharynx: Oropharynx is clear.  Eyes:     Extraocular Movements: Extraocular movements intact.     Pupils: Pupils are equal, round, and reactive to light.  Cardiovascular:     Rate and Rhythm: Normal rate and regular rhythm.      Pulses: Normal pulses.     Heart sounds: Murmur heard.  Pulmonary:     Effort: Pulmonary effort is normal. No respiratory distress.     Breath sounds: Normal breath sounds. No wheezing, rhonchi or rales.  Musculoskeletal:     Cervical back: Normal range of motion.     Right lower leg: No edema.     Left lower leg: No edema.  Lymphadenopathy:     Cervical: No cervical adenopathy.  Skin:    Comments: Callus to right medial 2nd toe Hyperkeratotic lesion to left forearm, see photos  Neurological:     General: No focal deficit present.     Mental Status: She is alert and oriented to person, place, and time.  Psychiatric:        Attention and Perception: Attention normal.        Mood and Affect: Mood normal.        Speech: Speech normal.        Behavior: Behavior normal. Behavior is cooperative.        Thought Content: Thought content normal.        Cognition and Memory: She exhibits impaired recent memory.        No results found  for any visits on 01/21/24.  The ASCVD Risk score (Arnett DK, et al., 2019) failed to calculate for the following reasons:   The 2019 ASCVD risk score is only valid for ages 57 to 42   * - Cholesterol units were assumed  BP Readings from Last 3 Encounters:  01/21/24 138/84  07/21/23 (!) 160/80  04/02/23 130/84   Wt Readings from Last 3 Encounters:  01/21/24 180 lb (81.6 kg)  07/21/23 172 lb 12.8 oz (78.4 kg)  04/02/23 170 lb (77.1 kg)      Last CBC Lab Results  Component Value Date   WBC 8.5 04/02/2023   HGB 13.5 04/02/2023   HCT 40.7 04/02/2023   MCV 91.3 04/02/2023   MCH 30.3 06/09/2021   RDW 12.8 04/02/2023   PLT 220.0 04/02/2023   Last metabolic panel Lab Results  Component Value Date   GLUCOSE 78 04/02/2023   NA 138 04/02/2023   K 4.3 04/02/2023   CL 101 04/02/2023   CO2 30 04/02/2023   BUN 17 04/02/2023   CREATININE 0.93 04/02/2023   GFR 56.47 (L) 04/02/2023   CALCIUM 8.9 04/02/2023   PHOS 3.0 04/02/2023   PROT 7.0  04/02/2023   ALBUMIN 4.1 04/02/2023   ALBUMIN 4.1 04/02/2023   BILITOT 0.4 04/02/2023   ALKPHOS 69 04/02/2023   AST 22 04/02/2023   ALT 20 04/02/2023   ANIONGAP 11 06/09/2021   Last lipids Lab Results  Component Value Date   CHOL 143 04/02/2023   HDL 48.30 04/02/2023   LDLCALC 76 04/02/2023   LDLDIRECT 92.0 03/28/2021   TRIG 94.0 04/02/2023   CHOLHDL 3 04/02/2023   Last hemoglobin A1c No results found for: HGBA1C Last thyroid  functions Lab Results  Component Value Date   TSH 1.18 04/02/2023   FREET4 0.73 03/14/2019   Last vitamin D  Lab Results  Component Value Date   VD25OH 34.44 04/02/2023   Last vitamin B12 and Folate No results found for: VITAMINB12, FOLATE       Assessment & Plan:   Assessment and Plan Assessment & Plan Skin lesion Skin lesion on left forearm, previously evaluated by dermatologist. Possible benign growth. - Schedule removal of lesion when supplies available. - Consider dermatology referral if lesion persists or worsens.  Memory Impairment - 6CIT score=8 - Chronic, mildly progressing, requires ongoing monitoring - Declines further eval today - Declines medical therapy  Primary Hypertension - chronic, stable, requires ongoing monitoring - refilled metoprolol  and losartan  today  Stage IIIa chronic kidney disease -CMP today, PTH today - Chronic, stable, requires ongoing monitoring - Continue losartan   Mixed hyperlipidemia - Chronic, stable, requires ongoing monitoring -Will check lipids today - Continue simvastatin   Cardiac murmur -Chronic, stable, requires ongoing monitoring - Continue metoprolol , losartan   Low serum PTH -Chronic, stable, requires ongoing monitoring -TSH and PTH today - Likely related to CKD, but will refer to endocrinology if needed  Vitamin D  deficiency - Chronic, stable, requires ongoing monitoring -Will check vitamin D  levels today - Not currently supplementing with vitamin D   Medication  Management - labs today, will dose adjust medications as indicated Discussed importance of monitoring vitamin D  and overall health. - Ordered lab tests including vitamin D  level. - Advised on appropriate footwear to prevent falls.     Orders Placed This Encounter  Procedures   CBC with Differential/Platelet    Release to patient:   Immediate [1]   Comprehensive metabolic panel with GFR    Release to patient:   Immediate [1]  Lipid panel   TSH   PTH, intact (no Ca)   VITAMIN D  25 Hydroxy (Vit-D Deficiency, Fractures)   Ambulatory referral to Dermatology    Referral Priority:   Routine    Referral Type:   Consultation    Referral Reason:   Specialty Services Required    Requested Specialty:   Dermatology    Number of Visits Requested:   1     Meds ordered this encounter  Medications   losartan  (COZAAR ) 50 MG tablet    Sig: Take 1 tablet (50 mg total) by mouth daily.    Dispense:  90 tablet    Refill:  3   metoprolol  succinate (TOPROL -XL) 50 MG 24 hr tablet    Sig: Take 1 tablet (50 mg total) by mouth daily. Take with or immediately following a meal.    Dispense:  90 tablet    Refill:  3   simvastatin  (ZOCOR ) 20 MG tablet    Sig: Take 1 tablet (20 mg total) by mouth daily.    Dispense:  90 tablet    Refill:  3    Return in about 3 months (around 04/20/2024) for meds OV.  Corean LITTIE Ku, FNP   "

## 2024-01-21 NOTE — Patient Instructions (Addendum)
 Continue current medication regimen.   Follow up with specialists as scheduled.   We are checking labs today, will be in contact with any results that require further attention.  I have sent in a referral to dermatology for you to have the area on your arm further evaluated.   Follow-up with me in 3 mos for medication management, sooner if needed.

## 2024-01-29 ENCOUNTER — Encounter: Payer: Self-pay | Admitting: Podiatry

## 2024-01-29 ENCOUNTER — Ambulatory Visit: Admitting: Podiatry

## 2024-01-29 DIAGNOSIS — B351 Tinea unguium: Secondary | ICD-10-CM

## 2024-01-29 DIAGNOSIS — M79674 Pain in right toe(s): Secondary | ICD-10-CM | POA: Diagnosis not present

## 2024-01-29 DIAGNOSIS — M79675 Pain in left toe(s): Secondary | ICD-10-CM

## 2024-01-29 NOTE — Progress Notes (Signed)
 Patient presents for evaluation and treatment of tenderness and some redness around nails feet.  Tenderness around toes with walking and wearing shoes.  Physical exam:  General appearance: Alert, pleasant, and in no acute distress.  Vascular: Pedal pulses: DP 2/4 B/L, PT 0/4 B/L. Mild edema lower legs bilaterally.  Capillary refill time immediate bilaterally  Neurologic:  Dermatologic:  Nails thickened, disfigured, discolored 1-5 BL with subungual debris.  Redness and hypertrophic nail folds along nail folds bilaterally but no signs of drainage or infection.  Musculoskeletal:     Diagnosis: 1. Painful onychomycotic nails 1 through 5 bilaterally. 2. Pain toes 1 through 5 bilaterally.  Plan: -Debrided onychomycotic nails 1 through 5 bilaterally.  Sharply debrided nails with nail clipper and reduced with a power bur.  Return 3 months RFC

## 2024-04-04 ENCOUNTER — Encounter: Admitting: Family Medicine

## 2024-04-21 ENCOUNTER — Ambulatory Visit: Admitting: Family Medicine

## 2024-04-29 ENCOUNTER — Ambulatory Visit: Admitting: Podiatry
